# Patient Record
Sex: Female | Born: 1980 | Race: Black or African American | Hispanic: No | Marital: Single | Smoking: Current every day smoker
Health system: Southern US, Community
[De-identification: ages and names within clinical notes are randomized; demographics above are authoritative.]

## PROBLEM LIST (undated history)

## (undated) DIAGNOSIS — J45909 Unspecified asthma, uncomplicated: Secondary | ICD-10-CM

---
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
## 2002-08-27 ENCOUNTER — Emergency Department (HOSPITAL_COMMUNITY): Admission: EM | Admit: 2002-08-27 | Discharge: 2002-08-28 | Payer: Self-pay | Admitting: *Deleted

## 2004-02-25 ENCOUNTER — Emergency Department (HOSPITAL_COMMUNITY): Admission: EM | Admit: 2004-02-25 | Discharge: 2004-02-25 | Payer: Self-pay | Admitting: Emergency Medicine

## 2004-12-08 ENCOUNTER — Emergency Department (HOSPITAL_COMMUNITY): Admission: EM | Admit: 2004-12-08 | Discharge: 2004-12-08 | Payer: Self-pay | Admitting: Emergency Medicine

## 2005-07-22 ENCOUNTER — Emergency Department (HOSPITAL_COMMUNITY): Admission: EM | Admit: 2005-07-22 | Discharge: 2005-07-22 | Payer: Self-pay | Admitting: Family Medicine

## 2005-07-31 ENCOUNTER — Emergency Department (HOSPITAL_COMMUNITY): Admission: EM | Admit: 2005-07-31 | Discharge: 2005-07-31 | Payer: Self-pay | Admitting: Family Medicine

## 2006-05-26 ENCOUNTER — Emergency Department (HOSPITAL_COMMUNITY): Admission: EM | Admit: 2006-05-26 | Discharge: 2006-05-26 | Payer: Self-pay | Admitting: Emergency Medicine

## 2006-12-23 ENCOUNTER — Inpatient Hospital Stay (HOSPITAL_COMMUNITY): Admission: AD | Admit: 2006-12-23 | Discharge: 2006-12-23 | Payer: Self-pay | Admitting: Obstetrics and Gynecology

## 2006-12-25 ENCOUNTER — Emergency Department (HOSPITAL_COMMUNITY): Admission: EM | Admit: 2006-12-25 | Discharge: 2006-12-25 | Payer: Self-pay | Admitting: Emergency Medicine

## 2006-12-31 ENCOUNTER — Emergency Department (HOSPITAL_COMMUNITY): Admission: EM | Admit: 2006-12-31 | Discharge: 2006-12-31 | Payer: Self-pay | Admitting: Family Medicine

## 2007-03-13 ENCOUNTER — Emergency Department (HOSPITAL_COMMUNITY): Admission: EM | Admit: 2007-03-13 | Discharge: 2007-03-14 | Payer: Self-pay | Admitting: Emergency Medicine

## 2007-08-03 ENCOUNTER — Emergency Department (HOSPITAL_COMMUNITY): Admission: EM | Admit: 2007-08-03 | Discharge: 2007-08-03 | Payer: Self-pay | Admitting: Family Medicine

## 2007-10-25 ENCOUNTER — Emergency Department (HOSPITAL_COMMUNITY): Admission: EM | Admit: 2007-10-25 | Discharge: 2007-10-25 | Payer: Self-pay | Admitting: Emergency Medicine

## 2007-12-03 ENCOUNTER — Emergency Department (HOSPITAL_COMMUNITY): Admission: EM | Admit: 2007-12-03 | Discharge: 2007-12-03 | Payer: Self-pay | Admitting: Emergency Medicine

## 2008-03-24 ENCOUNTER — Emergency Department (HOSPITAL_COMMUNITY): Admission: EM | Admit: 2008-03-24 | Discharge: 2008-03-24 | Payer: Self-pay | Admitting: Emergency Medicine

## 2008-10-07 ENCOUNTER — Emergency Department (HOSPITAL_COMMUNITY): Admission: EM | Admit: 2008-10-07 | Discharge: 2008-10-07 | Payer: Self-pay | Admitting: Emergency Medicine

## 2009-10-08 ENCOUNTER — Emergency Department (HOSPITAL_COMMUNITY): Admission: EM | Admit: 2009-10-08 | Discharge: 2009-10-08 | Payer: Self-pay | Admitting: Emergency Medicine

## 2009-12-08 ENCOUNTER — Emergency Department (HOSPITAL_COMMUNITY): Admission: EM | Admit: 2009-12-08 | Discharge: 2009-12-08 | Payer: Self-pay | Admitting: Emergency Medicine

## 2009-12-09 ENCOUNTER — Emergency Department (HOSPITAL_COMMUNITY)
Admission: EM | Admit: 2009-12-09 | Discharge: 2009-12-09 | Payer: Self-pay | Source: Home / Self Care | Admitting: Emergency Medicine

## 2010-03-25 ENCOUNTER — Inpatient Hospital Stay (INDEPENDENT_AMBULATORY_CARE_PROVIDER_SITE_OTHER)
Admission: RE | Admit: 2010-03-25 | Discharge: 2010-03-25 | Disposition: A | Discharge status: Home / Self Care | Payer: Self-pay | Source: Ambulatory Visit | Attending: Emergency Medicine | Admitting: Emergency Medicine

## 2010-03-25 DIAGNOSIS — K047 Periapical abscess without sinus: Secondary | ICD-10-CM

## 2010-03-27 LAB — POCT PREGNANCY, URINE: Preg Test, Ur: NEGATIVE

## 2010-04-24 LAB — URINALYSIS, ROUTINE W REFLEX MICROSCOPIC
Bilirubin Urine: NEGATIVE
Nitrite: NEGATIVE
Specific Gravity, Urine: 1.005 (ref 1.005–1.030)
Urobilinogen, UA: 0.2 mg/dL (ref 0.0–1.0)

## 2010-04-24 LAB — URINE MICROSCOPIC-ADD ON

## 2010-04-24 LAB — POCT I-STAT, CHEM 8
Calcium, Ion: 1.03 mmol/L — ABNORMAL LOW (ref 1.12–1.32)
Chloride: 103 mEq/L (ref 96–112)
Glucose, Bld: 81 mg/dL (ref 70–99)
HCT: 35 % — ABNORMAL LOW (ref 36.0–46.0)

## 2010-06-21 ENCOUNTER — Emergency Department (HOSPITAL_COMMUNITY): Payer: Self-pay

## 2010-06-21 ENCOUNTER — Emergency Department (HOSPITAL_COMMUNITY)
Admission: EM | Admit: 2010-06-21 | Discharge: 2010-06-21 | Disposition: A | Discharge status: Home / Self Care | Payer: Self-pay | Attending: Emergency Medicine | Admitting: Emergency Medicine

## 2010-06-21 DIAGNOSIS — J45909 Unspecified asthma, uncomplicated: Secondary | ICD-10-CM | POA: Insufficient documentation

## 2010-06-21 DIAGNOSIS — R1013 Epigastric pain: Secondary | ICD-10-CM | POA: Insufficient documentation

## 2010-06-21 DIAGNOSIS — R112 Nausea with vomiting, unspecified: Secondary | ICD-10-CM | POA: Insufficient documentation

## 2010-06-21 DIAGNOSIS — R10816 Epigastric abdominal tenderness: Secondary | ICD-10-CM | POA: Insufficient documentation

## 2010-06-21 LAB — URINALYSIS, ROUTINE W REFLEX MICROSCOPIC
Bilirubin Urine: NEGATIVE
Hgb urine dipstick: NEGATIVE
Ketones, ur: NEGATIVE mg/dL
Protein, ur: NEGATIVE mg/dL
Urobilinogen, UA: 1 mg/dL (ref 0.0–1.0)

## 2010-06-21 LAB — COMPREHENSIVE METABOLIC PANEL
BUN: 12 mg/dL (ref 6–23)
CO2: 28 mEq/L (ref 19–32)
Calcium: 8.9 mg/dL (ref 8.4–10.5)
GFR calc Af Amer: >60 mL/min (ref >60)
GFR calc non Af Amer: >60 mL/min (ref >60)
Glucose, Bld: 78 mg/dL (ref 70–99)
Total Protein: 7.3 g/dL (ref 6.0–8.3)

## 2010-06-21 LAB — DIFFERENTIAL
Basophils Absolute: 0 10*3/uL (ref 0.0–0.1)
Eosinophils Relative: 4 % (ref 0–5)
Lymphocytes Relative: 49 % — ABNORMAL HIGH (ref 12–46)
Lymphs Abs: 2.8 10*3/uL (ref 0.7–4.0)
Monocytes Absolute: 0.5 10*3/uL (ref 0.1–1.0)
Monocytes Relative: 9 % (ref 3–12)

## 2010-06-21 LAB — CBC
HCT: 38 % (ref 36.0–46.0)
MCH: 26.9 pg (ref 26.0–34.0)
MCHC: 33.2 g/dL (ref 30.0–36.0)
MCV: 81 fL (ref 78.0–100.0)
RDW: 14.3 % (ref 11.5–15.5)

## 2010-06-21 LAB — LIPASE, BLOOD: Lipase: 16 U/L (ref 11–59)

## 2010-10-06 LAB — CBC
HCT: 39.3
Platelets: 287
RDW: 14.9
WBC: 6.6

## 2010-10-06 LAB — I-STAT 8, (EC8 V) (CONVERTED LAB)
Acid-Base Excess: 3 — ABNORMAL HIGH
BUN: 10
Bicarbonate: 24.5 — ABNORMAL HIGH
Chloride: 103
Glucose, Bld: 123 — ABNORMAL HIGH
HCT: 43
Hemoglobin: 14.6
Operator id: 277751
Potassium: 3.7
Sodium: 134 — ABNORMAL LOW
TCO2: 25
pCO2, Ven: 28.5 — ABNORMAL LOW
pH, Ven: 7.542 — ABNORMAL HIGH

## 2010-10-06 LAB — URINE MICROSCOPIC-ADD ON

## 2010-10-06 LAB — DIFFERENTIAL
Basophils Absolute: 0
Eosinophils Relative: 2
Lymphocytes Relative: 7 — ABNORMAL LOW
Lymphs Abs: 0.5 — ABNORMAL LOW
Neutro Abs: 5.7
Neutrophils Relative %: 87 — ABNORMAL HIGH

## 2010-10-06 LAB — URINALYSIS, ROUTINE W REFLEX MICROSCOPIC
Bilirubin Urine: NEGATIVE
Ketones, ur: NEGATIVE
Nitrite: NEGATIVE
pH: 8.5 — ABNORMAL HIGH

## 2010-10-06 LAB — POCT PREGNANCY, URINE
Operator id: 277751
Preg Test, Ur: NEGATIVE

## 2010-10-06 LAB — POCT I-STAT CREATININE
Creatinine, Ser: 1.1
Operator id: 277751

## 2010-10-06 LAB — INFLUENZA A+B VIRUS AG-DIRECT(RAPID)
Inflenza A Ag: NEGATIVE
Influenza B Ag: NEGATIVE

## 2010-10-14 LAB — COMPREHENSIVE METABOLIC PANEL
Albumin: 3.8
BUN: 7
Chloride: 106
Creatinine, Ser: 0.8
Total Bilirubin: 0.6
Total Protein: 7

## 2010-10-14 LAB — CBC
HCT: 34.3 — ABNORMAL LOW
MCV: 80.1
Platelets: 50 — ABNORMAL LOW
RDW: 14.2
WBC: 6.3

## 2010-10-14 LAB — DIFFERENTIAL
Blasts: 0
Eosinophils Absolute: 0.3
Eosinophils Relative: 4
Myelocytes: 0
Neutro Abs: 2.7
Neutrophils Relative %: 44
WBC Morphology: INCREASED
nRBC: 0

## 2010-10-14 LAB — URINALYSIS, ROUTINE W REFLEX MICROSCOPIC
Glucose, UA: NEGATIVE
Ketones, ur: NEGATIVE
Nitrite: NEGATIVE
Red Sub, UA: NEGATIVE
pH: 6

## 2010-10-14 LAB — ACETAMINOPHEN LEVEL: Acetaminophen (Tylenol), Serum: <10 — ABNORMAL LOW

## 2010-10-14 LAB — RAPID URINE DRUG SCREEN, HOSP PERFORMED
Cocaine: NOT DETECTED
Tetrahydrocannabinol: NOT DETECTED

## 2010-10-17 LAB — WET PREP, GENITAL
Clue Cells Wet Prep HPF POC: NONE SEEN
Trich, Wet Prep: NONE SEEN
Yeast Wet Prep HPF POC: NONE SEEN

## 2010-10-17 LAB — POCT URINALYSIS DIP (DEVICE)
Bilirubin Urine: NEGATIVE
Glucose, UA: NEGATIVE
Ketones, ur: NEGATIVE
Operator id: 247071
Protein, ur: NEGATIVE
Specific Gravity, Urine: 1.01

## 2010-10-17 LAB — GC/CHLAMYDIA PROBE AMP, GENITAL: GC Probe Amp, Genital: NEGATIVE

## 2010-10-20 LAB — CBC
MCHC: 33.8
Platelets: 314
RBC: 4.71
WBC: 5

## 2010-10-20 LAB — GC/CHLAMYDIA PROBE AMP, GENITAL
Chlamydia, DNA Probe: POSITIVE — AB
GC Probe Amp, Genital: NEGATIVE

## 2010-10-20 LAB — HCG, SERUM, QUALITATIVE: Preg, Serum: NEGATIVE

## 2010-10-20 LAB — URINALYSIS, ROUTINE W REFLEX MICROSCOPIC
Glucose, UA: NEGATIVE
Ketones, ur: 15 — AB
Protein, ur: NEGATIVE
pH: 6

## 2010-10-20 LAB — DIFFERENTIAL
Basophils Relative: 1
Eosinophils Absolute: 0.1 — ABNORMAL LOW
Eosinophils Relative: 2
Lymphocytes Relative: 47 — ABNORMAL HIGH
Monocytes Absolute: 0.4
Neutrophils Relative %: 42 — ABNORMAL LOW

## 2010-10-20 LAB — URINE MICROSCOPIC-ADD ON

## 2010-10-20 LAB — WET PREP, GENITAL

## 2010-10-20 LAB — POCT PREGNANCY, URINE: Preg Test, Ur: NEGATIVE

## 2011-07-12 ENCOUNTER — Encounter (HOSPITAL_COMMUNITY): Payer: Self-pay | Admitting: *Deleted

## 2011-07-12 ENCOUNTER — Emergency Department (HOSPITAL_COMMUNITY)
Admission: EM | Admit: 2011-07-12 | Discharge: 2011-07-12 | Disposition: A | Discharge status: Home / Self Care | Payer: Self-pay | Attending: Emergency Medicine | Admitting: Emergency Medicine

## 2011-07-12 DIAGNOSIS — L923 Foreign body granuloma of the skin and subcutaneous tissue: Secondary | ICD-10-CM | POA: Insufficient documentation

## 2011-07-12 DIAGNOSIS — L299 Pruritus, unspecified: Secondary | ICD-10-CM | POA: Insufficient documentation

## 2011-07-12 MED ORDER — SULFAMETHOXAZOLE-TRIMETHOPRIM 800-160 MG PO TABS: 1.0000 | ORAL_TABLET | Freq: Two times a day (BID) | ORAL | Status: AC
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
## 2011-10-17 ENCOUNTER — Emergency Department (HOSPITAL_COMMUNITY): Payer: No Typology Code available for payment source

## 2011-10-17 ENCOUNTER — Encounter (HOSPITAL_COMMUNITY): Payer: Self-pay | Admitting: *Deleted

## 2011-10-17 ENCOUNTER — Emergency Department (HOSPITAL_COMMUNITY)
Admission: EM | Admit: 2011-10-17 | Discharge: 2011-10-17 | Disposition: A | Discharge status: Home / Self Care | Payer: No Typology Code available for payment source | Attending: Emergency Medicine | Admitting: Emergency Medicine

## 2011-10-17 DIAGNOSIS — R079 Chest pain, unspecified: Secondary | ICD-10-CM | POA: Insufficient documentation

## 2011-10-17 DIAGNOSIS — T1490XA Injury, unspecified, initial encounter: Secondary | ICD-10-CM | POA: Insufficient documentation

## 2011-10-17 DIAGNOSIS — F10929 Alcohol use, unspecified with intoxication, unspecified: Secondary | ICD-10-CM

## 2011-10-17 DIAGNOSIS — F1010 Alcohol abuse, uncomplicated: Secondary | ICD-10-CM | POA: Insufficient documentation

## 2011-10-17 DIAGNOSIS — M549 Dorsalgia, unspecified: Secondary | ICD-10-CM | POA: Insufficient documentation

## 2011-10-17 DIAGNOSIS — R52 Pain, unspecified: Secondary | ICD-10-CM | POA: Insufficient documentation

## 2011-10-17 DIAGNOSIS — R5381 Other malaise: Secondary | ICD-10-CM | POA: Insufficient documentation

## 2011-10-17 DIAGNOSIS — R109 Unspecified abdominal pain: Secondary | ICD-10-CM | POA: Insufficient documentation

## 2011-10-17 LAB — CBC WITH DIFFERENTIAL/PLATELET
Eosinophils Absolute: 0.2 10*3/uL (ref 0.0–0.7)
Eosinophils Relative: 4 % (ref 0–5)
HCT: 36.9 % (ref 36.0–46.0)
Lymphocytes Relative: 45 % (ref 12–46)
Lymphs Abs: 2.7 10*3/uL (ref 0.7–4.0)
MCH: 25.4 pg — ABNORMAL LOW (ref 26.0–34.0)
MCV: 79.4 fL (ref 78.0–100.0)
Monocytes Absolute: 0.3 10*3/uL (ref 0.1–1.0)
Monocytes Relative: 5 % (ref 3–12)
Platelets: 290 10*3/uL (ref 150–400)
RBC: 4.65 MIL/uL (ref 3.87–5.11)
WBC: 5.9 10*3/uL (ref 4.0–10.5)

## 2011-10-17 LAB — BASIC METABOLIC PANEL
BUN: 10 mg/dL (ref 6–23)
CO2: 27 mEq/L (ref 19–32)
Calcium: 8.8 mg/dL (ref 8.4–10.5)
Creatinine, Ser: 0.84 mg/dL (ref 0.50–1.10)
GFR calc non Af Amer: >90 mL/min (ref >90)
Glucose, Bld: 130 mg/dL — ABNORMAL HIGH (ref 70–99)

## 2011-10-17 LAB — RAPID URINE DRUG SCREEN, HOSP PERFORMED
Amphetamines: NOT DETECTED
Benzodiazepines: NOT DETECTED
Cocaine: NOT DETECTED
Opiates: NOT DETECTED
Tetrahydrocannabinol: NOT DETECTED

## 2011-10-17 MED ORDER — HYDROCODONE-ACETAMINOPHEN 5-325 MG PO TABS: 1.0000 | ORAL_TABLET | Freq: Four times a day (QID) | ORAL | Status: DC | PRN

## 2011-10-17 MED ORDER — IOHEXOL 300 MG/ML  SOLN
100.0000 mL | Freq: Once | INTRAMUSCULAR | Status: AC | PRN
  Administered 2011-10-17: 100 mL via INTRAVENOUS
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
## 2011-10-17 NOTE — ED Notes (Signed)
Patient transported to CT 

## 2013-04-19 ENCOUNTER — Emergency Department (HOSPITAL_COMMUNITY)
Admission: EM | Admit: 2013-04-19 | Discharge: 2013-04-20 | Disposition: A | Discharge status: Home / Self Care | Payer: No Typology Code available for payment source | Attending: Emergency Medicine | Admitting: Emergency Medicine

## 2013-04-19 DIAGNOSIS — IMO0002 Reserved for concepts with insufficient information to code with codable children: Secondary | ICD-10-CM

## 2013-04-19 DIAGNOSIS — S0180XA Unspecified open wound of other part of head, initial encounter: Secondary | ICD-10-CM | POA: Insufficient documentation

## 2013-04-19 DIAGNOSIS — S0100XA Unspecified open wound of scalp, initial encounter: Secondary | ICD-10-CM | POA: Insufficient documentation

## 2013-04-19 DIAGNOSIS — F17200 Nicotine dependence, unspecified, uncomplicated: Secondary | ICD-10-CM | POA: Insufficient documentation
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
## 2013-04-20 ENCOUNTER — Encounter (HOSPITAL_COMMUNITY): Payer: Self-pay | Admitting: Emergency Medicine
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
## 2013-04-20 NOTE — ED Notes (Signed)
Called without response 

## 2013-04-27 ENCOUNTER — Emergency Department (HOSPITAL_COMMUNITY)
Admission: EM | Admit: 2013-04-27 | Discharge: 2013-04-27 | Disposition: A | Discharge status: Home / Self Care | Payer: No Typology Code available for payment source | Attending: Emergency Medicine | Admitting: Emergency Medicine

## 2013-04-27 ENCOUNTER — Encounter (HOSPITAL_COMMUNITY): Payer: Self-pay | Admitting: Emergency Medicine

## 2013-04-27 DIAGNOSIS — F17200 Nicotine dependence, unspecified, uncomplicated: Secondary | ICD-10-CM | POA: Insufficient documentation

## 2013-04-27 DIAGNOSIS — Z4802 Encounter for removal of sutures: Secondary | ICD-10-CM | POA: Insufficient documentation

## 2013-04-27 NOTE — Discharge Instructions (Signed)
Bacitracin topically to the wound. Do not pick at the scab. Follow up as needed.    Suture Removal, Care After Refer to this sheet in the next few weeks. These instructions provide you with information on caring for yourself after your procedure. Your health care provider may also give you more specific instructions. Your treatment has been planned according to current medical practices, but problems sometimes occur. Call your health care provider if you have any problems or questions after your procedure. WHAT TO EXPECT AFTER THE PROCEDURE After your stitches (sutures) are removed, it is typical to have the following:  Some discomfort and swelling in the wound area.  Slight redness in the area. HOME CARE INSTRUCTIONS   If you have skin adhesive strips over the wound area, do not take the strips off. They will fall off on their own in a few days. If the strips remain in place after 14 days, you may remove them.  Change any bandages (dressings) at least once a day or as directed by your health care provider. If the bandage sticks, soak it off with warm, soapy water.  Apply cream or ointment only as directed by your health care provider. If using cream or ointment, wash the area with soap and water 2 times a day to remove all the cream or ointment. Rinse off the soap and pat the area dry with a clean towel.  Keep the wound area dry and clean. If the bandage becomes wet or dirty, or if it develops a bad smell, change it as soon as possible.  Continue to protect the wound from injury.  Use sunscreen when out in the sun. New scars become sunburned easily. SEEK MEDICAL CARE IF:  You have increasing redness, swelling, or pain in the wound.  You see pus coming from the wound.  You have a fever.  You notice a bad smell coming from the wound or dressing.  Your wound breaks open (edges not staying together). Document Released: 09/23/2000 Document Revised: 10/19/2012 Document Reviewed:  08/10/2012 [facility] Patient Information 2014 [place]ExitCare, [place].

## 2013-04-27 NOTE — ED Provider Notes (Signed)
Medical screening examination/treatment/procedure(s) were performed by non-physician practitioner and as supervising physician I was immediately available for consultation/collaboration.   EKG Interpretation None       Sunnie NielsenBrian Mariette Cowley, MD 04/27/13 2300
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
## 2013-04-27 NOTE — ED Notes (Signed)
Pt in requesting suture removal from right forehead area, placed on 4/8

## 2013-05-01 NOTE — ED Provider Notes (Signed)
Medical screening examination/treatment/procedure(s) were performed by non-physician practitioner and as supervising physician I was immediately available for consultation/collaboration.   EKG Interpretation None       Raeford RazorStephen Hazen Brumett, MD 05/01/13 [phone]

## 2013-06-01 ENCOUNTER — Encounter (HOSPITAL_COMMUNITY): Payer: Self-pay | Admitting: Emergency Medicine

## 2013-06-01 ENCOUNTER — Emergency Department (HOSPITAL_COMMUNITY)
Admission: EM | Admit: 2013-06-01 | Discharge: 2013-06-01 | Disposition: A | Discharge status: Home / Self Care | Payer: No Typology Code available for payment source | Attending: Emergency Medicine | Admitting: Emergency Medicine

## 2013-06-01 DIAGNOSIS — Z23 Encounter for immunization: Secondary | ICD-10-CM | POA: Insufficient documentation

## 2013-06-01 DIAGNOSIS — F17200 Nicotine dependence, unspecified, uncomplicated: Secondary | ICD-10-CM | POA: Insufficient documentation

## 2013-06-01 DIAGNOSIS — S5010XA Contusion of unspecified forearm, initial encounter: Secondary | ICD-10-CM | POA: Insufficient documentation

## 2013-06-01 DIAGNOSIS — S51809A Unspecified open wound of unspecified forearm, initial encounter: Secondary | ICD-10-CM | POA: Insufficient documentation

## 2013-06-01 DIAGNOSIS — J45909 Unspecified asthma, uncomplicated: Secondary | ICD-10-CM | POA: Insufficient documentation

## 2013-06-01 DIAGNOSIS — Y939 Activity, unspecified: Secondary | ICD-10-CM | POA: Insufficient documentation

## 2013-06-01 DIAGNOSIS — W503XXA Accidental bite by another person, initial encounter: Secondary | ICD-10-CM | POA: Insufficient documentation

## 2013-06-01 DIAGNOSIS — Y929 Unspecified place or not applicable: Secondary | ICD-10-CM | POA: Insufficient documentation

## 2013-06-01 HISTORY — DX: Unspecified asthma, uncomplicated: J45.909

## 2013-06-01 MED ORDER — PENICILLIN V POTASSIUM 500 MG PO TABS: 500.0000 mg | ORAL_TABLET | Freq: Three times a day (TID) | ORAL | Status: DC

## 2013-06-01 MED ORDER — TETANUS-DIPHTH-ACELL PERTUSSIS 5-2.5-18.5 LF-MCG/0.5 IM SUSP
0.5000 mL | Freq: Once | INTRAMUSCULAR | Status: AC
  Administered 2013-06-01: 0.5 mL via INTRAMUSCULAR
  Filled 2013-06-01: qty 0.5

## 2013-06-01 MED ORDER — SULFAMETHOXAZOLE-TRIMETHOPRIM 800-160 MG PO TABS: 1.0000 | ORAL_TABLET | Freq: Two times a day (BID) | ORAL | Status: DC
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
## 2013-06-02 NOTE — ED Provider Notes (Signed)
Medical screening examination/treatment/procedure(s) were performed by non-physician practitioner and as supervising physician I was immediately available for consultation/collaboration.   EKG Interpretation None        Galit Urich J. Jakevion Arney, MD 06/02/13 0832 

## 2013-11-13 ENCOUNTER — Encounter (HOSPITAL_COMMUNITY): Payer: Self-pay | Admitting: Emergency Medicine

## 2014-12-16 ENCOUNTER — Emergency Department (HOSPITAL_COMMUNITY): Payer: Self-pay

## 2014-12-16 ENCOUNTER — Encounter (HOSPITAL_COMMUNITY): Payer: Self-pay | Admitting: *Deleted

## 2014-12-16 ENCOUNTER — Emergency Department (HOSPITAL_COMMUNITY)
Admission: EM | Admit: 2014-12-16 | Discharge: 2014-12-16 | Disposition: A | Discharge status: Home / Self Care | Payer: Self-pay | Attending: Emergency Medicine | Admitting: Emergency Medicine

## 2014-12-16 DIAGNOSIS — S0003XA Contusion of scalp, initial encounter: Secondary | ICD-10-CM | POA: Insufficient documentation

## 2014-12-16 DIAGNOSIS — J45909 Unspecified asthma, uncomplicated: Secondary | ICD-10-CM | POA: Insufficient documentation

## 2014-12-16 DIAGNOSIS — S29002A Unspecified injury of muscle and tendon of back wall of thorax, initial encounter: Secondary | ICD-10-CM | POA: Insufficient documentation

## 2014-12-16 DIAGNOSIS — Y9389 Activity, other specified: Secondary | ICD-10-CM | POA: Insufficient documentation

## 2014-12-16 DIAGNOSIS — S3992XA Unspecified injury of lower back, initial encounter: Secondary | ICD-10-CM | POA: Insufficient documentation

## 2014-12-16 DIAGNOSIS — S0990XA Unspecified injury of head, initial encounter: Secondary | ICD-10-CM

## 2014-12-16 DIAGNOSIS — S199XXA Unspecified injury of neck, initial encounter: Secondary | ICD-10-CM | POA: Insufficient documentation

## 2014-12-16 DIAGNOSIS — F17200 Nicotine dependence, unspecified, uncomplicated: Secondary | ICD-10-CM | POA: Insufficient documentation

## 2014-12-16 DIAGNOSIS — Y92410 Unspecified street and highway as the place of occurrence of the external cause: Secondary | ICD-10-CM | POA: Insufficient documentation

## 2014-12-16 DIAGNOSIS — Y998 Other external cause status: Secondary | ICD-10-CM | POA: Insufficient documentation

## 2014-12-16 MED ORDER — IBUPROFEN 800 MG PO TABS: 800.0000 mg | ORAL_TABLET | Freq: Three times a day (TID) | ORAL | Status: DC

## 2014-12-16 MED ORDER — IBUPROFEN 800 MG PO TABS
800.0000 mg | ORAL_TABLET | Freq: Once | ORAL | Status: AC
  Administered 2014-12-16: 800 mg via ORAL
  Filled 2014-12-16: qty 1
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
## 2015-01-24 ENCOUNTER — Emergency Department (HOSPITAL_COMMUNITY): Payer: No Typology Code available for payment source

## 2015-01-24 ENCOUNTER — Emergency Department (HOSPITAL_COMMUNITY)
Admission: EM | Admit: 2015-01-24 | Discharge: 2015-01-24 | Disposition: A | Discharge status: Home / Self Care | Payer: No Typology Code available for payment source | Attending: Physician Assistant | Admitting: Physician Assistant

## 2015-01-24 ENCOUNTER — Encounter (HOSPITAL_COMMUNITY): Payer: Self-pay | Admitting: Emergency Medicine

## 2015-01-24 DIAGNOSIS — S3992XA Unspecified injury of lower back, initial encounter: Secondary | ICD-10-CM | POA: Diagnosis not present

## 2015-01-24 DIAGNOSIS — J45909 Unspecified asthma, uncomplicated: Secondary | ICD-10-CM | POA: Insufficient documentation

## 2015-01-24 DIAGNOSIS — F17200 Nicotine dependence, unspecified, uncomplicated: Secondary | ICD-10-CM | POA: Insufficient documentation

## 2015-01-24 DIAGNOSIS — Y92410 Unspecified street and highway as the place of occurrence of the external cause: Secondary | ICD-10-CM | POA: Insufficient documentation

## 2015-01-24 DIAGNOSIS — Y9389 Activity, other specified: Secondary | ICD-10-CM | POA: Insufficient documentation

## 2015-01-24 DIAGNOSIS — Y998 Other external cause status: Secondary | ICD-10-CM | POA: Diagnosis not present

## 2015-01-24 DIAGNOSIS — S199XXA Unspecified injury of neck, initial encounter: Secondary | ICD-10-CM | POA: Diagnosis not present

## 2015-01-24 MED ORDER — CYCLOBENZAPRINE HCL 10 MG PO TABS: 10.0000 mg | ORAL_TABLET | Freq: Two times a day (BID) | ORAL | Status: DC | PRN

## 2015-01-24 MED ORDER — HYDROCODONE-ACETAMINOPHEN 5-325 MG PO TABS
1.0000 | ORAL_TABLET | Freq: Once | ORAL | Status: AC
Start: 2015-01-24 — End: 2015-01-24
  Administered 2015-01-24: 1 via ORAL
  Filled 2015-01-24: qty 1

## 2015-01-24 MED ORDER — IBUPROFEN 800 MG PO TABS: 800.0000 mg | ORAL_TABLET | Freq: Three times a day (TID) | ORAL | Status: DC
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
## 2015-01-24 NOTE — ED Notes (Signed)
Patient transported to CT 
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
## 2015-01-24 NOTE — Discharge Instructions (Signed)
Take your medications as prescribed. I also recommend applying ice to affected regions for 15-20 minutes 3-4 times daily. Please follow up with a primary care provider from the Resource Guide provided below in 3-4 days. Please return to the Emergency Department if symptoms worsen or new onset of fever, headache, visual changes, lightheadedness, dizziness, numbness, tingling, weakness, groin anesthesia, loss of bowel or bladder.    Emergency Department Resource Guide 1) Find a Doctor and Pay Out of Pocket Although you won't have to find out who is covered by your insurance plan, it is a good idea to ask around and get recommendations. You will then need to call the office and see if the doctor you have chosen will accept you as a new patient and what types of options they offer for patients who are self-pay. Some doctors offer discounts or will set up payment plans for their patients who do not have insurance, but you will need to ask so you aren't surprised when you get to your appointment.  2) Contact Your Local Health Department Not all health departments have doctors that can see patients for sick visits, but many do, so it is worth a call to see if yours does. If you don't know where your local health department is, you can check in your phone book. The CDC also has a tool to help you locate your state's health department, and many state websites also have listings of all of their local health departments.  3) Find a Walk-in Clinic If your illness is not likely to be very severe or complicated, you may want to try a walk in clinic. These are popping up all over the country in pharmacies, drugstores, and shopping centers. They're usually staffed by nurse practitioners or physician assistants that have been trained to treat common illnesses and complaints. They're usually fairly quick and inexpensive. However, if you have serious medical issues or chronic medical problems, these are probably not your  best option.  No Primary Care Doctor: - Call Health Connect at  [phone] - they can help you locate a primary care doctor that  accepts your insurance, provides certain services, etc. - Physician Referral Service- [phone]  Chronic Pain Problems: Organization         Address  Phone   Notes  [facility]  [phone] Patients need to be referred by their primary care doctor.   Medication Assistance: Organization         Address  Phone   Notes  [facility] [address] [place], [place] [address] [phone]) [phone] --Must be a resident of [place] [facility] [place] -- Must have NO insurance coverage whatsoever (no Medicaid/ Medicare, etc.) -- The pt. MUST have a primary care doctor that directs their care regularly and follows them in the community   MedAssist  [phone]   [employer]  [phone]) [phone]    Agencies that provide inexpensive medical care: Organization         Address  Phone   Notes  [facility]  [phone]) [phone]   [facility]    [phone]   [facility] [address] [place], [place] [address] [phone]) [phone]   [facility] [address], [place] [phone]   [facility]anned [facility]    [phone]   [facility]    [phone]   [facility]  [address], [place] Phone:  [phone], Fax:  [phone] Hours of Operation:  9 am - 6 pm, M-F.  Also accepts Medicaid/Medicare and self-pay.  [facility] [place], [address], [place] Phone: [phone], Fax: [phone]. Hours of Operation:  8:30 am - 5:30 pm, M-F.  Also accepts Medicaid and self-pay.  [facility] [place] [address] [place] Phone: [phone]   [place], [place], [place] [phone], Ext. [phone] Mondays & Thursdays: 7-9 AM.  First 15  patients are seen on a first come, first serve basis.    [place] Providers:  Organization         Address  Phone   Notes  [facility] [address], [place] [phone] Also accepts self-pay patients.  [facility] [address], [place]  [phone]   [place], [address], [place] [phone]   [facility] [address], [place] [phone]   Lucianne Lei [address], [place]   [phone] Only accepts [place] Access [place] patients after they have their name applied to their card.   Self-Pay (no insurance) in [facility]:  Organization         Address  Phone   Notes  Sickle Cell Patients, [facility] [place] [phone]   [facility] [place] [phone]   [facility]  [place], [place], [place] [phone]   [facility]/Dr. Osei-Bonsu  [address], [place] or [address], [place] [phone] Phone number for both [place] and [place] locations is the same.  [facility] [address] [place] [phone]   [facility] [address], [place] or [address] [phone]   [facility] [address] [place] [phone], phone; [phone], fax Sees patients 1st and 3rd Saturday of every month.  Must not qualify for public or private insurance (i.e. Medicaid, Medicare, [place] Health Choice, Veterans' Benefits)  Household income should be no more than 200% of the poverty level The clinic cannot treat you if you are pregnant or think you are pregnant  Sexually transmitted diseases are not treated at the clinic.    Dental  Care: Organization         Address  Phone  Notes  [facility] [place] [phone] Accepts children up to age 58 who are enrolled in [place] or [place]; pregnant women with a Medicaid card; and children who have applied for Medicaid or [place] Health Choice, but were declined, whose parents can pay a reduced fee at time of service.  [facility]  [address], [place] [phone] Accepts children up to age 82 who are enrolled in [place] or [place]; pregnant women with a Medicaid card; and children who have applied for Medicaid or  Health Choice, but were declined, whose parents can pay a reduced fee at time of service.  [facility] Adult Dental Access PROGRAM  [place] [phone] Patients are  seen by appointment only. Walk-ins are not accepted. [place] will see patients 55 years of age and older. Monday - Tuesday (8am-5pm) Most Wednesdays (8:30-5pm) $30 per visit, cash only  [facility] Adult Dental Access PROGRAM  [address], [facility] [phone] Patients are seen by appointment only. Walk-ins are not accepted. [place] will see patients 42 years of age and older. One Wednesday Evening (Monthly: Volunteer Based).  $30 per visit, cash only  [place]  [phone] for adults; Children under age 42, call Graduate Pediatric Dentistry at [phone]. Children aged 37-14, please call [phone] to request a pediatric application.  Dental services are provided in all areas of dental care including fillings, crowns and bridges, complete and partial dentures, implants, gum treatment, root canals, and extractions. Preventive care is also provided. Treatment is provided to both adults and children. Patients are selected via a lottery and there is often a waiting list.   [facility] [address] [place]  [phone] [email or web address]   [facility] [address] [place], [place] [phone], Ext. [phone] Second and Fourth Thursday of each month, opens at 6:30 AM; Clinic ends at 9 AM.  Patients are seen on a first-come first-served basis, and a limited number are seen during each clinic.   [facility]  [address] Hillard Danker [place], [place] [phone]   Eligibility Requirements You must have lived in [place], [place], or [place] counties for at least the last three months.   You cannot be eligible for state or federal sponsored [employer], including [employer], [place], or [employer].   You generally cannot be eligible for healthcare insurance through your employer.    How to apply: Eligibility screenings are held every Tuesday and Wednesday afternoon from 1:00 pm until 4:00 pm. You do not need an appointment for the interview!  [facility] [address], [place], [place]   [place]  [place] Department  [place]  [phone]    Behavioral Health Resources in the Community: Intensive Outpatient Programs Organization         Address  Phone  Notes  [place]. [address], [place], [place] [phone]   [facility] [address] [place], [place]   ADS: [facility] Drug [facility] [address], [place], [place]   [place] [address]  [place], [place] or [phone]   Substance Abuse Resources Organization         Address  Phone  Notes  Al[facility] Drug Services  [phone]   [place]  [phone]   [facility] [place]   Chinita Pester  [phone]   Residential & Outpatient Substance Abuse Program  [phone]    Psychological Services Organization         Address  Phone  Notes  [facility] [place]  [place]  [phone]   [place] [address] [place] or [phone]    Mobile Crisis Teams Organization         Address  Phone  Notes  [facility], Mobile Crisis Care Unit  [phone]   [facility]ive Psychotherapeutic Services  [address] [place], [place]   [facility] [address] [place]  [place] [phone]    Self-Help/Support Groups Organization         Address  Phone             Notes  [facility] of [place] - variety of support groups  [phone]- I7437963 Call for more information  Narcotics Anonymous (NA), [facility] [address], [employer] [place]  2 meetings at this location   Statistician         Address  Phone  Notes  [facility] [address],    [place]  [phone]   [facility]  [address] [place] [address], [place], [place] [phone]   [facility] [address] [place], [place] [phone] Admissions: 8am-3pm M-F  [facility] [address],    [place], [place] [phone]   [facility] [address] [place], [place], [place] [phone]   [facility] [address],  [place], [place] [phone]   [facility] - Intensive Outpatient [address]ure[address], [place], [place] [phone]   [facility] ([facility] [address] [place].,  [place], [place] [phone] or [phone]   [facility] ([facility]) [address] [place], [place] [phone] Accepts Medicaid  [facility] [address],  [place] [phone] Substance Abuse/Addiction Treatment   [facility] Organization         Address  Phone  Notes  [facility]  [phone]   Angie Fava, PhD [address] Ervin Knack [place], [place]   [phone] or 7147826561   [facility] Behavioral   [address] [place], [place] [phone]   [facility] [address] [place], [place] [phone] Insurance/Medicaid/sponsorship through [facility] [address]                                    [place], [place] [phone] Therapy/tele-psych/case  [facility] [address], [place] [phone]    Dr. Lolly Mustache  [phone]   [facility] [place]  United Way [facility] 1) [address], [place] 2) [address] [place] 3)  [address], [place] [phone]  [phone]   [facility] Child Abuse Hotline 743-018-6863 or 785-174-0867 (After Hours)

## 2017-07-18 ENCOUNTER — Emergency Department (HOSPITAL_COMMUNITY): Payer: No Typology Code available for payment source

## 2017-07-18 ENCOUNTER — Emergency Department (HOSPITAL_COMMUNITY)
Admission: EM | Admit: 2017-07-18 | Discharge: 2017-07-18 | Disposition: A | Discharge status: Home / Self Care | Payer: No Typology Code available for payment source | Attending: Emergency Medicine | Admitting: Emergency Medicine

## 2017-07-18 ENCOUNTER — Other Ambulatory Visit: Payer: Self-pay

## 2017-07-18 ENCOUNTER — Encounter (HOSPITAL_COMMUNITY): Payer: Self-pay

## 2017-07-18 DIAGNOSIS — F17200 Nicotine dependence, unspecified, uncomplicated: Secondary | ICD-10-CM | POA: Insufficient documentation

## 2017-07-18 DIAGNOSIS — J45909 Unspecified asthma, uncomplicated: Secondary | ICD-10-CM | POA: Diagnosis not present

## 2017-07-18 DIAGNOSIS — M7918 Myalgia, other site: Secondary | ICD-10-CM | POA: Insufficient documentation

## 2017-07-18 LAB — POC URINE PREG, ED: Preg Test, Ur: NEGATIVE

## 2017-07-18 MED ORDER — IBUPROFEN 800 MG PO TABS
800.0000 mg | ORAL_TABLET | Freq: Once | ORAL | Status: AC
  Administered 2017-07-18: 800 mg via ORAL
  Filled 2017-07-18: qty 1

## 2017-07-18 MED ORDER — IBUPROFEN 800 MG PO TABS: 800.0000 mg | ORAL_TABLET | Freq: Three times a day (TID) | ORAL | 0 refills | Status: AC
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
## 2017-07-18 NOTE — ED Notes (Signed)
Patient transported to X-ray 

## 2017-07-18 NOTE — ED Notes (Signed)
Patient verbalizes understanding of discharge instructions. Opportunity for questioning and answers were provided. Armband removed by staff, pt discharged from ED.  

## 2017-07-18 NOTE — Discharge Instructions (Signed)
Do not be surprised if you are more sore in the morning. I have prescribed you Ibuprofen to assist with inflammation and pain. You may also use heat or cold compresses for additional relief.
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
## 2018-04-16 ENCOUNTER — Emergency Department (HOSPITAL_COMMUNITY): Payer: Self-pay

## 2018-04-16 ENCOUNTER — Encounter (HOSPITAL_COMMUNITY): Payer: Self-pay | Admitting: Emergency Medicine

## 2018-04-16 ENCOUNTER — Emergency Department (HOSPITAL_COMMUNITY)
Admission: EM | Admit: 2018-04-16 | Discharge: 2018-04-16 | Disposition: A | Discharge status: Home / Self Care | Payer: Self-pay | Attending: Emergency Medicine | Admitting: Emergency Medicine

## 2018-04-16 ENCOUNTER — Other Ambulatory Visit: Payer: Self-pay

## 2018-04-16 DIAGNOSIS — Y999 Unspecified external cause status: Secondary | ICD-10-CM | POA: Insufficient documentation

## 2018-04-16 DIAGNOSIS — J45909 Unspecified asthma, uncomplicated: Secondary | ICD-10-CM | POA: Insufficient documentation

## 2018-04-16 DIAGNOSIS — Y929 Unspecified place or not applicable: Secondary | ICD-10-CM | POA: Insufficient documentation

## 2018-04-16 DIAGNOSIS — Y939 Activity, unspecified: Secondary | ICD-10-CM | POA: Insufficient documentation

## 2018-04-16 DIAGNOSIS — S39012A Strain of muscle, fascia and tendon of lower back, initial encounter: Secondary | ICD-10-CM

## 2018-04-16 DIAGNOSIS — S161XXA Strain of muscle, fascia and tendon at neck level, initial encounter: Secondary | ICD-10-CM

## 2018-04-16 DIAGNOSIS — F17210 Nicotine dependence, cigarettes, uncomplicated: Secondary | ICD-10-CM | POA: Insufficient documentation

## 2018-04-16 LAB — CBC
HCT: 39.4 % (ref 36.0–46.0)
Hemoglobin: 11.6 g/dL — ABNORMAL LOW (ref 12.0–15.0)
MCH: 23.5 pg — ABNORMAL LOW (ref 26.0–34.0)
MCHC: 29.4 g/dL — ABNORMAL LOW (ref 30.0–36.0)
MCV: 79.8 fL — ABNORMAL LOW (ref 80.0–100.0)
Platelets: 337 10*3/uL (ref 150–400)
RBC: 4.94 MIL/uL (ref 3.87–5.11)
RDW: 16.3 % — ABNORMAL HIGH (ref 11.5–15.5)
WBC: 7 10*3/uL (ref 4.0–10.5)
nRBC: 0 % (ref 0.0–0.2)

## 2018-04-16 LAB — COMPREHENSIVE METABOLIC PANEL
ALT: 69 U/L — ABNORMAL HIGH (ref 0–44)
AST: 25 U/L (ref 15–41)
Albumin: 3.4 g/dL — ABNORMAL LOW (ref 3.5–5.0)
Alkaline Phosphatase: 66 U/L (ref 38–126)
Anion gap: 9 (ref 5–15)
BUN: 8 mg/dL (ref 6–20)
CO2: 25 mmol/L (ref 22–32)
Calcium: 9 mg/dL (ref 8.9–10.3)
Chloride: 104 mmol/L (ref 98–111)
Creatinine, Ser: 0.74 mg/dL (ref 0.44–1.00)
GFR calc Af Amer: >60 mL/min (ref >60)
GFR calc non Af Amer: >60 mL/min (ref >60)
Glucose, Bld: 125 mg/dL — ABNORMAL HIGH (ref 70–99)
Potassium: 3.2 mmol/L — ABNORMAL LOW (ref 3.5–5.1)
Sodium: 138 mmol/L (ref 135–145)
Total Bilirubin: 0.1 mg/dL — ABNORMAL LOW (ref 0.3–1.2)
Total Protein: 6.8 g/dL (ref 6.5–8.1)

## 2018-04-16 LAB — ETHANOL: Alcohol, Ethyl (B): 185 mg/dL — ABNORMAL HIGH (ref <10)

## 2018-04-16 LAB — I-STAT BETA HCG BLOOD, ED (MC, WL, AP ONLY): I-stat hCG, quantitative: <5 m[IU]/mL (ref <5)

## 2018-04-16 MED ORDER — IOHEXOL 300 MG/ML  SOLN
125.0000 mL | Freq: Once | INTRAMUSCULAR | Status: AC | PRN
  Administered 2018-04-16: 125 mL via INTRAVENOUS

## 2018-04-16 MED ORDER — METHOCARBAMOL 500 MG PO TABS: 500.0000 mg | ORAL_TABLET | Freq: Three times a day (TID) | ORAL | 0 refills | Status: DC | PRN

## 2018-04-16 MED ORDER — ONDANSETRON HCL 4 MG/2ML IJ SOLN
4.0000 mg | Freq: Once | INTRAMUSCULAR | Status: AC
  Administered 2018-04-16: 03:00:00 4 mg via INTRAVENOUS
  Filled 2018-04-16: qty 2

## 2018-04-16 MED ORDER — SODIUM CHLORIDE 0.9 % IV BOLUS
1000.0000 mL | Freq: Once | INTRAVENOUS | Status: AC
  Administered 2018-04-16: 03:00:00 1000 mL via INTRAVENOUS

## 2018-04-16 MED ORDER — MORPHINE SULFATE (PF) 4 MG/ML IV SOLN
4.0000 mg | Freq: Once | INTRAVENOUS | Status: AC
  Administered 2018-04-16: 4 mg via INTRAVENOUS
  Filled 2018-04-16: qty 1

## 2018-04-16 MED ORDER — IBUPROFEN 800 MG PO TABS: 800.0000 mg | ORAL_TABLET | Freq: Four times a day (QID) | ORAL | 0 refills | Status: DC | PRN
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
## 2018-04-16 NOTE — ED Notes (Signed)
Reviewed d/c instructions with pt, who verbalized understanding and had no outstanding questions. Pt departed in NAD, refused use of wheelchair.   

## 2018-04-16 NOTE — ED Notes (Signed)
Patient transported to X-ray 

## 2019-05-06 ENCOUNTER — Ambulatory Visit: Payer: Self-pay | Attending: Internal Medicine

## 2019-05-06 DIAGNOSIS — Z23 Encounter for immunization: Secondary | ICD-10-CM

## 2019-05-06 NOTE — Progress Notes (Signed)
   Covid-19 Vaccination Clinic  Name:  DESERA GRAFFEO    MRN: [id number] DOB: 13-Feb-1980  05/06/2019  Ms. Uecker was observed post Covid-19 immunization for 15 minutes without incident. She was provided with Vaccine Information Sheet and instruction to access the V-Safe system.   Ms. Stys was instructed to call 911 with any severe reactions post vaccine: Marland Kitchen Difficulty breathing  . Swelling of face and throat  . A fast heartbeat  . A bad rash all over body  . Dizziness and weakness   Immunizations Administered    Name Date Dose VIS Date Route   Pfizer COVID-19 Vaccine 05/06/2019  9:17 AM 0.3 mL 03/08/2018 Intramuscular   Manufacturer: [employer], [employer]   Lot: [id number]   NDC: [address]-4

## 2019-06-03 ENCOUNTER — Ambulatory Visit: Payer: Self-pay | Attending: Internal Medicine

## 2019-06-03 DIAGNOSIS — Z23 Encounter for immunization: Secondary | ICD-10-CM

## 2019-06-03 NOTE — Progress Notes (Signed)
   Covid-19 Vaccination Clinic  Name:  Brenda Hickman    MRN: [id number] DOB: 12/12/1980  06/03/2019  [name] was observed post Covid-19 immunization for 15 minutes without incident. She was provided with Vaccine Information Sheet and instruction to access the V-Safe system.   [name] was instructed to call 911 with any severe reactions post vaccine: Marland Kitchen Difficulty breathing  . Swelling of face and throat  . A fast heartbeat  . A bad rash all over body  . Dizziness and weakness   Immunizations Administered    Name Date Dose VIS Date Route   Pfizer COVID-19 Vaccine 06/03/2019  8:10 AM 0.3 mL 03/08/2018 Intramuscular   Manufacturer: [employer], [employer]   Lot: [id number]   NDC: [address]-0

## 2019-11-25 ENCOUNTER — Ambulatory Visit: Payer: Self-pay

## 2019-12-16 ENCOUNTER — Other Ambulatory Visit: Payer: Self-pay

## 2019-12-16 ENCOUNTER — Ambulatory Visit: Payer: No Typology Code available for payment source | Attending: Internal Medicine

## 2019-12-16 DIAGNOSIS — Z23 Encounter for immunization: Secondary | ICD-10-CM

## 2019-12-16 NOTE — Progress Notes (Signed)
   Covid-19 Vaccination Clinic  Name:  LAMONDA NOXON    MRN: [id number] DOB: October 23, 1980  12/16/2019  Ms. Lardizabal was observed post Covid-19 immunization for 15 minutes without incident. She was provided with Vaccine Information Sheet and instruction to access the V-Safe system.   Ms. Ruble was instructed to call 911 with any severe reactions post vaccine: Marland Kitchen Difficulty breathing  . Swelling of face and throat  . A fast heartbeat  . A bad rash all over body  . Dizziness and weakness   Immunizations Administered    No immunizations on file.

## 2019-12-18 ENCOUNTER — Encounter (HOSPITAL_COMMUNITY): Payer: Self-pay | Admitting: Emergency Medicine

## 2019-12-18 ENCOUNTER — Other Ambulatory Visit: Payer: Self-pay

## 2019-12-18 ENCOUNTER — Ambulatory Visit (HOSPITAL_COMMUNITY)
Admission: EM | Admit: 2019-12-18 | Discharge: 2019-12-18 | Disposition: A | Discharge status: Home / Self Care | Payer: HRSA Program | Attending: Emergency Medicine | Admitting: Emergency Medicine

## 2019-12-18 DIAGNOSIS — Z20822 Contact with and (suspected) exposure to covid-19: Secondary | ICD-10-CM | POA: Diagnosis present

## 2019-12-18 DIAGNOSIS — R531 Weakness: Secondary | ICD-10-CM | POA: Diagnosis not present

## 2019-12-18 DIAGNOSIS — T50Z95A Adverse effect of other vaccines and biological substances, initial encounter: Secondary | ICD-10-CM

## 2019-12-18 DIAGNOSIS — R439 Unspecified disturbances of smell and taste: Secondary | ICD-10-CM | POA: Diagnosis not present

## 2019-12-18 DIAGNOSIS — R5383 Other fatigue: Secondary | ICD-10-CM

## 2019-12-18 DIAGNOSIS — R432 Parageusia: Secondary | ICD-10-CM

## 2019-12-18 LAB — SARS CORONAVIRUS 2 (TAT 6-24 HRS): SARS Coronavirus 2: NEGATIVE

## 2019-12-18 MED ORDER — ONDANSETRON 4 MG PO TBDP: 4.0000 mg | ORAL_TABLET | Freq: Three times a day (TID) | ORAL | 0 refills | Status: DC | PRN

## 2019-12-18 NOTE — Discharge Instructions (Signed)
I would expect improvement of your symptoms in the next 24 hours if they are related to your recent vaccine.  We will call you if your testing is positive, please review your results on your MyChart.

## 2019-12-18 NOTE — ED Triage Notes (Signed)
Pt c/o loss of taste and smell onset Saturday night. Pt states she got her covid booster on Saturday. Pt states she went to smoke and could not smell or taste the cigarette.

## 2019-12-18 NOTE — ED Provider Notes (Signed)
[facility]    CSN: [id number] Arrival date & time: 12/18/19  0818      History   Chief Complaint Chief Complaint  Patient presents with  . loss of taste  . loss of smell    HPI Brenda Hickman is a 39 y.o. female.   Brenda Hickman presents with complaints of loss of taste and smell, fatigue, and right arm pain. Symptoms started two days ago, following receipt of her covid-19 booster injection. No diarrhea. Some abdominal discomfort. No fevers. No cough or congestion. No sore throat. No known ill contacts.    ROS per HPI, negative if not otherwise mentioned.      Past Medical History:  Diagnosis Date  . Asthma     There are no problems to display for this patient.   History reviewed. No pertinent surgical history.  OB History    Gravida  1   Para  1   Term      Preterm      AB      Living        SAB      TAB      Ectopic      Multiple      Live Births               Home Medications    Prior to Admission medications   Medication Sig Start Date End Date Taking? Authorizing Provider  cyclobenzaprine (FLEXERIL) 10 MG tablet Take 1 tablet (10 mg total) by mouth 2 (two) times daily as needed for muscle spasms. Patient not taking: Reported on 07/18/2017 01/24/15   Barrett Henle, PA-C  ibuprofen (ADVIL,MOTRIN) 800 MG tablet Take 1 tablet (800 mg total) by mouth every 6 (six) hours as needed for moderate pain. 04/16/18   Gilda Crease, MD  methocarbamol (ROBAXIN) 500 MG tablet Take 1 tablet (500 mg total) by mouth every 8 (eight) hours as needed for muscle spasms. 04/16/18   Gilda Crease, MD  ondansetron (ZOFRAN-ODT) 4 MG disintegrating tablet Take 1 tablet (4 mg total) by mouth every 8 (eight) hours as needed for nausea or vomiting. 12/18/19   Georgetta Haber, NP  penicillin v potassium (VEETID) 500 MG tablet Take 1 tablet (500 mg total) by mouth 3 (three) times daily. Patient not taking: Reported on 12/16/2014  06/01/13   Marlon Pel, PA-C  sulfamethoxazole-trimethoprim (SEPTRA DS) 800-160 MG per tablet Take 1 tablet by mouth every 12 (twelve) hours. Patient not taking: Reported on 12/16/2014 06/01/13   Marlon Pel, PA-C    Family History Family History  Family history unknown: Yes    Social History Social History   Tobacco Use  . Smoking status: Current Every Day Smoker    Packs/day: 1.50  . Smokeless tobacco: Never Used  Substance Use Topics  . Alcohol use: Yes  . Drug use: Yes    Types: Marijuana     Allergies   Patient has no known allergies.   Review of Systems Review of Systems   Physical Exam Triage Vital Signs ED Triage Vitals  Enc Vitals Group     BP 12/18/19 0844 (!) 164/106     Pulse Rate 12/18/19 0844 96     Resp --      Temp 12/18/19 0844 98.7 F (37.1 C)     Temp Source 12/18/19 0844 Oral     SpO2 12/18/19 0844 100 %     Weight --  Height --      Head Circumference --      Peak Flow --      Pain Score 12/18/19 0839 0     Pain Loc --      Pain Edu? --      Excl. in GC? --    No data found.  Updated Vital Signs BP (!) 164/106 (BP Location: Left Arm)   Pulse 96   Temp 98.7 F (37.1 C) (Oral)   LMP 12/09/2019   SpO2 100%   Visual Acuity Right Eye Distance:   Left Eye Distance:   Bilateral Distance:    Right Eye Near:   Left Eye Near:    Bilateral Near:     Physical Exam Constitutional:      General: She is not in acute distress.    Appearance: She is well-developed.  Cardiovascular:     Rate and Rhythm: Normal rate.  Pulmonary:     Effort: Pulmonary effort is normal.  Abdominal:     Tenderness: There is no abdominal tenderness.  Skin:    General: Skin is warm and dry.  Neurological:     Mental Status: She is alert and oriented to person, place, and time.      UC Treatments / Results  Labs (all labs ordered are listed, but only abnormal results are displayed) Labs Reviewed  SARS CORONAVIRUS 2 (TAT 6-24 HRS)     EKG   Radiology No results found.  Procedures Procedures (including critical care time)  Medications Ordered in UC Medications - No data to display  Initial Impression / Assessment and Plan / UC Course  I have reviewed the triage vital signs and the nursing notes.  Pertinent labs & imaging results that were available during my care of the patient were reviewed by me and considered in my medical decision making (see chart for details).     Non toxic. Benign physical exam.  Recent covid booster, likely etiology of feeling poor. Covid testing pending and isolation instructions provided due to loss of taste and smell. Supportive cares recommended. Return precautions provided. Patient verbalized understanding and agreeable to plan.    Final Clinical Impressions(s) / UC Diagnoses   Final diagnoses:  Loss of taste  Side effects of vaccination, initial encounter  Fatigue, unspecified type     Discharge Instructions     I would expect improvement of your symptoms in the next 24 hours if they are related to your recent vaccine.  We will call you if your testing is positive, please review your results on your MyChart.    ED Prescriptions    Medication Sig Dispense Auth. Provider   ondansetron (ZOFRAN-ODT) 4 MG disintegrating tablet Take 1 tablet (4 mg total) by mouth every 8 (eight) hours as needed for nausea or vomiting. 12 tablet Georgetta Haber, NP     PDMP not reviewed this encounter.   Georgetta Haber, NP 12/18/19 [phone]

## 2020-05-21 ENCOUNTER — Ambulatory Visit (HOSPITAL_COMMUNITY)
Admission: EM | Admit: 2020-05-21 | Discharge: 2020-05-21 | Disposition: A | Discharge status: Home / Self Care | Payer: Self-pay | Attending: Internal Medicine | Admitting: Internal Medicine

## 2020-05-21 ENCOUNTER — Other Ambulatory Visit: Payer: Self-pay

## 2020-05-21 ENCOUNTER — Encounter (HOSPITAL_COMMUNITY): Payer: Self-pay | Admitting: Emergency Medicine

## 2020-05-21 DIAGNOSIS — S8011XA Contusion of right lower leg, initial encounter: Secondary | ICD-10-CM

## 2020-05-21 DIAGNOSIS — W19XXXA Unspecified fall, initial encounter: Secondary | ICD-10-CM

## 2020-05-21 MED ORDER — IBUPROFEN 800 MG PO TABS: 800.0000 mg | ORAL_TABLET | Freq: Four times a day (QID) | ORAL | 0 refills | Status: AC | PRN

## 2020-05-21 NOTE — ED Provider Notes (Signed)
[facility]    CSN: [id number] Arrival date & time: 05/21/20  1244      History   Chief Complaint Chief Complaint  Patient presents with  . Fall    HPI Brenda Hickman is a 40 y.o. female who presents due to falling off a chair at work yesterday. She was standing on a chair cleaning an ice machine and the chair tumbled and she landed on her buttocks and a chair fell onto her R knee and shin. Today she stared feeling pain on her R great toe.     Past Medical History:  Diagnosis Date  . Asthma     There are no problems to display for this patient.   History reviewed. No pertinent surgical history.  OB History    Gravida  1   Para  1   Term      Preterm      AB      Living        SAB      IAB      Ectopic      Multiple      Live Births               Home Medications    Prior to Admission medications   Medication Sig Start Date End Date Taking? Authorizing Provider  ibuprofen (ADVIL) 800 MG tablet Take 1 tablet (800 mg total) by mouth every 6 (six) hours as needed for moderate pain. 05/21/20   Rodriguez-Southworth, Nettie Elm, PA-C    Family History Family History  Problem Relation Age of Onset  . Cancer Mother     Social History Social History   Tobacco Use  . Smoking status: Current Every Day Smoker    Packs/day: 1.50  . Smokeless tobacco: Never Used  Vaping Use  . Vaping Use: Never used  Substance Use Topics  . Alcohol use: Yes  . Drug use: Yes    Types: Marijuana     Allergies   Patient has no known allergies.   Review of Systems Review of Systems  Musculoskeletal: Positive for arthralgias and gait problem. Negative for back pain, neck pain and neck stiffness.  Skin: Negative for color change, pallor, rash and wound.  Neurological: Negative for headaches.     Physical Exam Triage Vital Signs ED Triage Vitals [05/21/20 1425]  Enc Vitals Group     BP (!) 159/84     Pulse Rate 83     Resp (!) 24     Temp  98.6 F (37 C)     Temp Source Oral     SpO2 100 %     Weight      Height      Head Circumference      Peak Flow      Pain Score      Pain Loc      Pain Edu?      Excl. in GC?    No data found.  Updated Vital Signs BP (!) 159/84 (BP Location: Right Arm) Comment (BP Location): large cuff  Pulse 83   Temp 98.6 F (37 C) (Oral)   Resp (!) 24   SpO2 100%   Visual Acuity Right Eye Distance:   Left Eye Distance:   Bilateral Distance:    Right Eye Near:   Left Eye Near:    Bilateral Near:     Physical Exam Vitals (in pain when she moves) and nursing note reviewed.  Constitutional:  Appearance: She is obese.  HENT:     Head: Normocephalic.     Right Ear: External ear normal.     Left Ear: External ear normal.  Eyes:     General: No scleral icterus.    Conjunctiva/sclera: Conjunctivae normal.  Pulmonary:     Effort: Pulmonary effort is normal.  Musculoskeletal:     Cervical back: Neck supple. No tenderness.     Right lower leg: No edema.     Left lower leg: No edema.     Comments: R thigh- with soft tissue tenderness and swelling on distal quad, but ROM and strength is normal.  R KNEE- with faint bruise below patella, but no bone tenderness. R HIP- normal ROM with no pain R FOOT- no swelling or open wounds. R great toe is a little tender, but has normal ROM.   Skin:    General: Skin is warm and dry.     Findings: No rash.     Comments: BACK/ Buttocks- with no abrasions, bruising, or tederness  Neurological:     Mental Status: She is alert and oriented to person, place, and time.     Gait: Gait abnormal.     Comments: Has a limp  Psychiatric:        Mood and Affect: Mood normal.        Behavior: Behavior normal.        Thought Content: Thought content normal.    UC Treatments / Results  Labs (all labs ordered are listed, but only abnormal results are displayed) Labs Reviewed - No data to display  EKG   Radiology No results  found.  Procedures Procedures (including critical care time)  Medications Ordered in UC Medications - No data to display  Initial Impression / Assessment and Plan / UC Course  I have reviewed the triage vital signs and the nursing notes. Has contusion of R lower extremity, but since she is able to walk with a mild limp and does not have bone pain with exam, I believe she is just bruised.  I sent rx of Ibuprofen. See instructions.    Final Clinical Impressions(s) / UC Diagnoses   Final diagnoses:  Contusion of multiple sites of right lower extremity, initial encounter  Fall, initial encounter     Discharge Instructions     Ice areas of pain for 20 minutes placing a cloth over the skin for 48 hours from the onset of pain.   Follow up with your family Dr next week.      ED Prescriptions    Medication Sig Dispense Auth. Provider   ibuprofen (ADVIL) 800 MG tablet Take 1 tablet (800 mg total) by mouth every 6 (six) hours as needed for moderate pain. 30 tablet Rodriguez-Southworth, Nettie Elm, PA-C     PDMP not reviewed this encounter.   Garey Ham, PA-C 05/21/20 1454

## 2020-05-21 NOTE — Discharge Instructions (Signed)
Ice areas of pain for 20 minutes placing a cloth over the skin for 48 hours from the onset of pain.   Follow up with your family Dr next week.

## 2020-05-21 NOTE — ED Triage Notes (Signed)
Patient was standing in a chair cleaning an ice machine.  Chair moved and she fell to the floor.  Incident occurred yesterday.  Today.  Right leg is painful from hip to toes.  Right leg is swollen.

## 2020-11-13 ENCOUNTER — Encounter (HOSPITAL_COMMUNITY): Payer: Self-pay

## 2020-11-13 ENCOUNTER — Emergency Department (HOSPITAL_COMMUNITY)
Admission: EM | Admit: 2020-11-13 | Discharge: 2020-11-13 | Disposition: A | Discharge status: Home / Self Care | Payer: Self-pay | Attending: Emergency Medicine | Admitting: Emergency Medicine

## 2020-11-13 ENCOUNTER — Emergency Department (HOSPITAL_COMMUNITY): Payer: Self-pay

## 2020-11-13 ENCOUNTER — Other Ambulatory Visit: Payer: Self-pay

## 2020-11-13 DIAGNOSIS — J45909 Unspecified asthma, uncomplicated: Secondary | ICD-10-CM | POA: Insufficient documentation

## 2020-11-13 DIAGNOSIS — F17210 Nicotine dependence, cigarettes, uncomplicated: Secondary | ICD-10-CM | POA: Insufficient documentation

## 2020-11-13 DIAGNOSIS — M79662 Pain in left lower leg: Secondary | ICD-10-CM | POA: Insufficient documentation

## 2020-11-13 DIAGNOSIS — M7989 Other specified soft tissue disorders: Secondary | ICD-10-CM | POA: Insufficient documentation

## 2020-11-13 DIAGNOSIS — M25472 Effusion, left ankle: Secondary | ICD-10-CM

## 2020-11-13 MED ORDER — PREDNISONE 50 MG PO TABS: ORAL_TABLET | ORAL | 0 refills | Status: DC

## 2020-11-13 MED ORDER — HYDROCODONE-ACETAMINOPHEN 5-325 MG PO TABS: 1.0000 | ORAL_TABLET | Freq: Four times a day (QID) | ORAL | 0 refills | Status: DC | PRN

## 2020-11-13 MED ORDER — HYDROCODONE-ACETAMINOPHEN 5-325 MG PO TABS
1.0000 | ORAL_TABLET | Freq: Once | ORAL | Status: AC
  Administered 2020-11-13: 1 via ORAL
  Filled 2020-11-13: qty 1

## 2020-11-13 MED ORDER — IBUPROFEN 200 MG PO TABS
400.0000 mg | ORAL_TABLET | Freq: Once | ORAL | Status: AC
  Administered 2020-11-13: 400 mg via ORAL
  Filled 2020-11-13: qty 2

## 2020-11-13 MED ORDER — PREDNISONE 20 MG PO TABS: 60.0000 mg | ORAL_TABLET | Freq: Once | ORAL | Status: DC

## 2020-11-13 NOTE — ED Provider Notes (Signed)
[facility]-EMERGENCY DEPT Provider Note   CSN: [id number] Arrival date & time: 11/13/20  0335     History Chief complaint - left leg pain  Brenda Hickman is a 40 y.o. female.  The history is provided by the patient.  Leg Pain Location:  Ankle, foot and leg Leg location:  L lower leg Ankle location:  L ankle Foot location:  L foot Pain details:    Quality:  Aching   Severity:  Moderate   Onset quality:  Gradual   Timing:  Constant   Progression:  Worsening Chronicity:  New Relieved by:  Nothing Worsened by:  Bearing weight Associated symptoms: no fever and no muscle weakness   Patient with history of asthma and obesity presents with left leg pain.  She denies any trauma.  She reports she started the night shift at [employer] and several hours later began having pain in her left leg.  She reports it hurts to walk.  She reports the pain was so severe she called 911 She has no other acute complaints.  No previous history of surgery or injury to the leg    Past Medical History:  Diagnosis Date   Asthma        OB History     Gravida  1   Para  1   Term      Preterm      AB      Living         SAB      IAB      Ectopic      Multiple      Live Births              Family History  Problem Relation Age of Onset   Cancer Mother     Social History   Tobacco Use   Smoking status: Every Day    Packs/day: 1.50    Types: Cigarettes   Smokeless tobacco: Never  Vaping Use   Vaping Use: Never used  Substance Use Topics   Alcohol use: Yes   Drug use: Yes    Types: Marijuana    Home Medications Prior to Admission medications   Medication Sig Start Date End Date Taking? Authorizing Provider  HYDROcodone-acetaminophen (NORCO/VICODIN) 5-325 MG tablet Take 1 tablet by mouth every 6 (six) hours as needed. 11/13/20  Yes Zadie Rhine, MD  predniSONE (DELTASONE) 50 MG tablet 1 tablet PO QD X4 days 11/13/20  Yes Zadie Rhine,  MD  ibuprofen (ADVIL) 800 MG tablet Take 1 tablet (800 mg total) by mouth every 6 (six) hours as needed for moderate pain. 05/21/20   Rodriguez-Southworth, Nettie Elm, PA-C    Allergies    Patient has no known allergies.  Review of Systems   Review of Systems  Constitutional:  Negative for fever.  Musculoskeletal:  Positive for arthralgias and myalgias.  All other systems reviewed and are negative.  Physical Exam Updated Vital Signs BP (!) 188/100 (BP Location: Right Arm)   Pulse 81   Temp 98.2 F (36.8 C) (Oral)   Resp 16   Ht 1.6 m (5\' 3" )   Wt 111.1 kg   LMP 10/15/2020   SpO2 100%   BMI 43.40 kg/m   Physical Exam CONSTITUTIONAL: Well developed/well nourished, uncomfortable appearing HEAD: Normocephalic/atraumatic EYES: EOMI ENMT: Mucous membranes moist NECK: supple no meningeal signs LUNGS: no apparent distress ABDOMEN: soft, obese NEURO: Pt is awake/alert/appropriate, moves all extremitiesx4.  No facial droop.   EXTREMITIES: pulses  normal/equal, full ROM, tenderness distal tibia as well as bilateral malleoli.  Mild left foot tenderness.  Distal pulses equal intact.  No significant calf tenderness.  No significant edema.  No crepitus.  Mild swelling noted over the left ankle without any warmth SKIN: warm, color normal PSYCH: no abnormalities of mood noted, alert and oriented to situation  ED Results / Procedures / Treatments   Labs (all labs ordered are listed, but only abnormal results are displayed) Labs Reviewed - No data to display  EKG None  Radiology DG Tibia/Fibula Left  Result Date: 11/13/2020 CLINICAL DATA:  40 year old female with left lower extremity pain while at work. EXAM: LEFT TIBIA AND FIBULA - 2 VIEW COMPARISON:  Left ankle series today. FINDINGS: Bone mineralization is within normal limits. Joint spaces and alignment at the knee appear normal. No knee joint effusion is evident. There is no evidence of fracture or other focal bone lesions. Possible  subcutaneous venous varicosities but no other soft tissue abnormality. IMPRESSION: Negative. Electronically Signed   By: Odessa Fleming M.D.   On: 11/13/2020 04:45   DG Ankle 2 Views Left  Result Date: 11/13/2020 CLINICAL DATA:  40 year old female with left lower extremity pain while at work. EXAM: LEFT ANKLE - 2 VIEW COMPARISON:  None. FINDINGS: Bone mineralization is within normal limits. Mortise joint alignment preserved. There is some degenerative spurring at the tibial plafond, bilateral malleoli and calcaneus. No evidence of joint effusion. No acute osseous abnormality identified. IMPRESSION: Some degenerative spurring about the left ankle. No acute osseous abnormality identified. Electronically Signed   By: Odessa Fleming M.D.   On: 11/13/2020 04:44   DG Foot 2 Views Left  Result Date: 11/13/2020 CLINICAL DATA:  40 year old female with left lower extremity pain while at work. EXAM: LEFT FOOT - 2 VIEW COMPARISON:  Left ankle series today. FINDINGS: Bone mineralization is within normal limits. Degenerative spurring redemonstrated at the lateral malleolus, calcaneus. Preserved joint spaces and alignment in the left foot. No acute osseous abnormality identified. No discrete soft tissue abnormality. IMPRESSION: No acute osseous abnormality identified. Electronically Signed   By: Odessa Fleming M.D.   On: 11/13/2020 04:46    Procedures Procedures   Medications Ordered in ED Medications  predniSONE (DELTASONE) tablet 60 mg (60 mg Oral Not Given 11/13/20 0616)  HYDROcodone-acetaminophen (NORCO/VICODIN) 5-325 MG per tablet 1 tablet (1 tablet Oral Given 11/13/20 0411)  ibuprofen (ADVIL) tablet 400 mg (400 mg Oral Given 11/13/20 0411)    ED Course  I have reviewed the triage vital signs and the nursing notes.  Pertinent imaging results that were available during my care of the patient were reviewed by me and considered in my medical decision making (see chart for details).    MDM Rules/Calculators/A&P                            Patient presented with left ankle pain and swelling that started while working.  No trauma.  She can fully range her left ankle but it is tender to palpation.  No signs of DVT.  Distal pulses equal and intact Could be reactive arthritis.  We will start pain meds, prednisone and advised elevation.  She is low risk for septic joint.  We discussed strict  return precautions Final Clinical Impression(s) / ED Diagnoses Final diagnoses:  Left ankle swelling    Rx / DC Orders ED Discharge Orders          Ordered  HYDROcodone-acetaminophen (NORCO/VICODIN) 5-325 MG tablet  Every 6 hours PRN        11/13/20 0548    predniSONE (DELTASONE) 50 MG tablet        11/13/20 0548             Zadie Rhine, MD 11/13/20 [id number]

## 2020-11-13 NOTE — Discharge Instructions (Signed)
Keep your leg elevated.  You can place ice over the swelling Patient improved over the next 48 hours. If you have worsening pain, redness, swelling or redness up your left leg in the next 48 hours please return to the ER

## 2020-11-13 NOTE — ED Notes (Signed)
Patient transported to X-ray 

## 2020-11-13 NOTE — ED Triage Notes (Signed)
Pt complains of left ankle pain after leaving work this morning.

## 2021-08-28 ENCOUNTER — Encounter (HOSPITAL_COMMUNITY): Payer: Self-pay

## 2021-08-28 ENCOUNTER — Emergency Department (HOSPITAL_COMMUNITY)
Admission: EM | Admit: 2021-08-28 | Discharge: 2021-08-28 | Disposition: A | Discharge status: Home / Self Care | Payer: Self-pay | Attending: Emergency Medicine | Admitting: Emergency Medicine

## 2021-08-28 ENCOUNTER — Other Ambulatory Visit: Payer: Self-pay

## 2021-08-28 DIAGNOSIS — L259 Unspecified contact dermatitis, unspecified cause: Secondary | ICD-10-CM | POA: Insufficient documentation

## 2021-08-28 DIAGNOSIS — L299 Pruritus, unspecified: Secondary | ICD-10-CM | POA: Insufficient documentation

## 2021-08-28 DIAGNOSIS — L309 Dermatitis, unspecified: Secondary | ICD-10-CM

## 2021-08-28 MED ORDER — HYDROXYZINE HCL 25 MG PO TABS: 25.0000 mg | ORAL_TABLET | Freq: Four times a day (QID) | ORAL | 0 refills | Status: DC

## 2021-08-28 NOTE — ED Triage Notes (Signed)
Pt arrives via EMS from home. Pt reports she had having an itch in both hands yesterday while at work. Pt states the itching spread all over her body. She attempted a bath that had some bleach and vinegar mixed in with out any help. No obvious hives noted, patient does have reddened skin from scratching.

## 2021-08-28 NOTE — ED Provider Notes (Signed)
[facility]-EMERGENCY DEPT Provider Note   CSN: [id number] Arrival date & time: 08/28/21  1002     History  Chief Complaint  Patient presents with   Pruritis    Brenda Hickman is a 41 y.o. female.  41 year old female with prior medical history as detailed below presents for evaluation.  Patient reports that feeling itchy on her hands and her entire body.  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  Typically after coming home from work she notes that her hands are dry and itchy.  Her daughter does have a history of eczema.  She has used her to daughters skin lotions in the past for treatment of her dry skin and that has helped significantly in the past.  Patient reports that her itching was worse today.  She did not take anything for itch.  The history is provided by the patient and medical records.       Home Medications Prior to Admission medications   Medication Sig Start Date End Date Taking? Authorizing Provider  hydrOXYzine (ATARAX) 25 MG tablet Take 1 tablet (25 mg total) by mouth every 6 (six) hours. 08/28/21  Yes Wynetta Fines, MD  HYDROcodone-acetaminophen (NORCO/VICODIN) 5-325 MG tablet Take 1 tablet by mouth every 6 (six) hours as needed. 11/13/20   Zadie Rhine, MD  ibuprofen (ADVIL) 800 MG tablet Take 1 tablet (800 mg total) by mouth every 6 (six) hours as needed for moderate pain. 05/21/20   Rodriguez-Southworth, Nettie Elm, PA-C  predniSONE (DELTASONE) 50 MG tablet 1 tablet PO QD X4 days 11/13/20   Zadie Rhine, MD      Allergies    Patient has no known allergies.    Review of Systems   Review of Systems  All other systems reviewed and are negative.   Physical Exam Updated Vital Signs BP 131/74 (BP Location: Left Arm)   Pulse 87   Temp 98 F (36.7 C) (Oral)   Resp 18   Ht 5\' 3"  (1.6 m)   Wt 127 kg   SpO2 95%   BMI 49.60 kg/m  Physical Exam Vitals and nursing note reviewed.  Constitutional:       General: She is not in acute distress.    Appearance: Normal appearance. She is well-developed.  HENT:     Head: Normocephalic and atraumatic.  Eyes:     Conjunctiva/sclera: Conjunctivae normal.     Pupils: Pupils are equal, round, and reactive to light.  Cardiovascular:     Rate and Rhythm: Normal rate and regular rhythm.     Heart sounds: Normal heart sounds.  Pulmonary:     Effort: Pulmonary effort is normal. No respiratory distress.     Breath sounds: Normal breath sounds.  Abdominal:     General: There is no distension.     Palpations: Abdomen is soft.     Tenderness: There is no abdominal tenderness.  Musculoskeletal:        General: No deformity. Normal range of motion.     Cervical back: Normal range of motion and neck supple.  Skin:    General: Skin is warm and dry.     Comments: Dry scaly skin changes noted on both hands and forearms.  No urticaria.  No active evidence of systemic allergic reaction.  No other significant skin findings.  Neurological:     General: No focal deficit present.     Mental Status: She is alert and oriented to person, place, and time.  ED Results / Procedures / Treatments   Labs (all labs ordered are listed, but only abnormal results are displayed) Labs Reviewed - No data to display  EKG None  Radiology No results found.  Procedures Procedures    Medications Ordered in ED Medications - No data to display  ED Course/ Medical Decision Making/ A&P                           Medical Decision Making Risk Prescription drug management.    Medical Screen Complete  This patient presented to the ED with complaint of itching.  This complaint involves an extensive number of treatment options. The initial differential diagnosis includes, but is not limited to, eczema  This presentation is: Acute, Chronic, Self-Limited, Previously Undiagnosed, Uncertain Prognosis, and Complicated  Patient with complaint of diffuse  itching.  Patient with history suggestive of longstanding eczema.  Patient's symptoms today are not suggestive of significant allergic type reaction or other significant pathology.  Patient educated about how to care for her right likely eczema.  Importance of close follow-up stressed.  Strict return precautions given and understood.   Additional history obtained:  External records from outside sources obtained and reviewed including prior ED visits and prior Inpatient records.  Problem List / ED Course:  Itching, eczema   Reevaluation:  After the interventions noted above, I reevaluated the patient and found that they have: improved  Disposition:  After consideration of the diagnostic results and the patients response to treatment, I feel that the patent would benefit from close outpatient follow-up.          Final Clinical Impression(s) / ED Diagnoses Final diagnoses:  Itching  Eczema, unspecified type    Rx / DC Orders ED Discharge Orders          Ordered    hydrOXYzine (ATARAX) 25 MG tablet  Every 6 hours        08/28/21 1050              Wynetta Fines, MD 08/28/21 1053

## 2021-08-28 NOTE — Discharge Instructions (Addendum)
Return for any problem.  Use Atarax as prescribed for itch.  Apply cocoa butter and/or Eucerin cream as instructed for dry skin.

## 2022-04-25 IMAGING — CR DG ANKLE 2V *L*
2 series · 2 of 2 positions shown · non-contrast
Comparison: None.

CLINICAL DATA: 40-year-old female with left lower extremity pain
while at work.

EXAM:
LEFT ANKLE - 2 VIEW

[x ankle ap left]
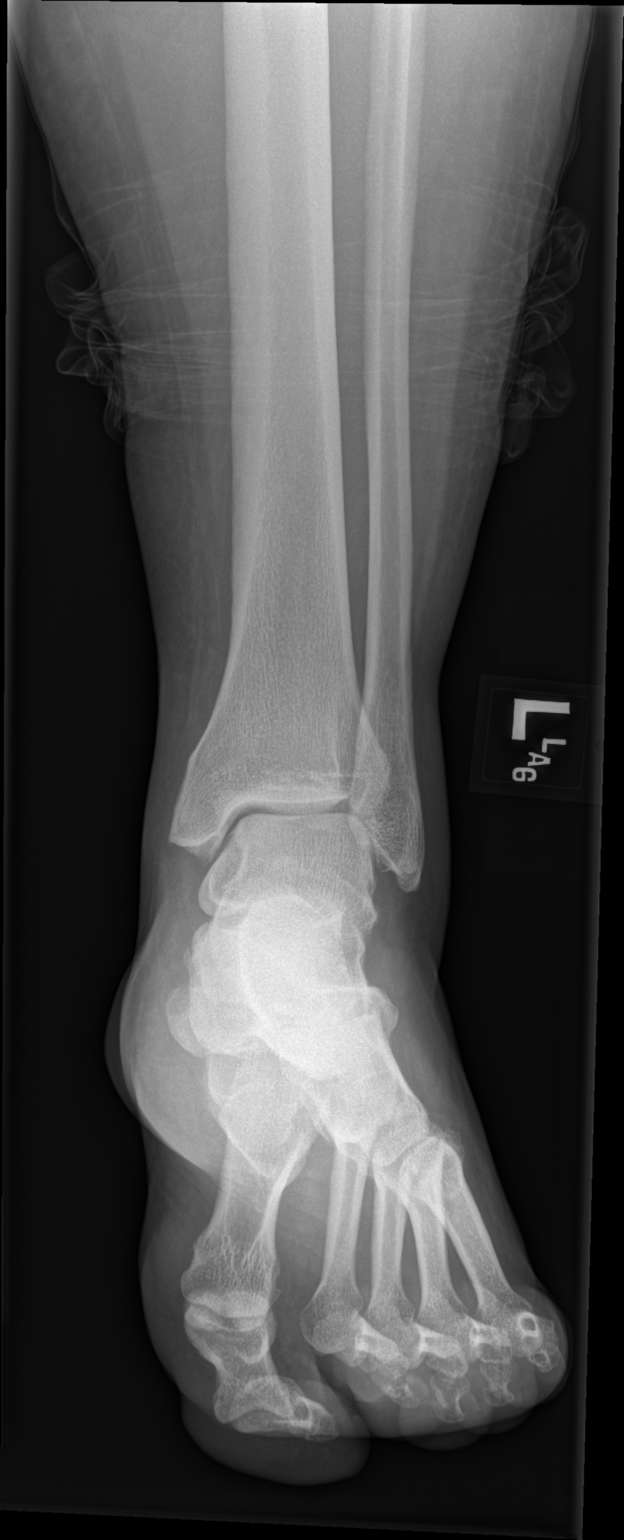

[x ankle lat left]
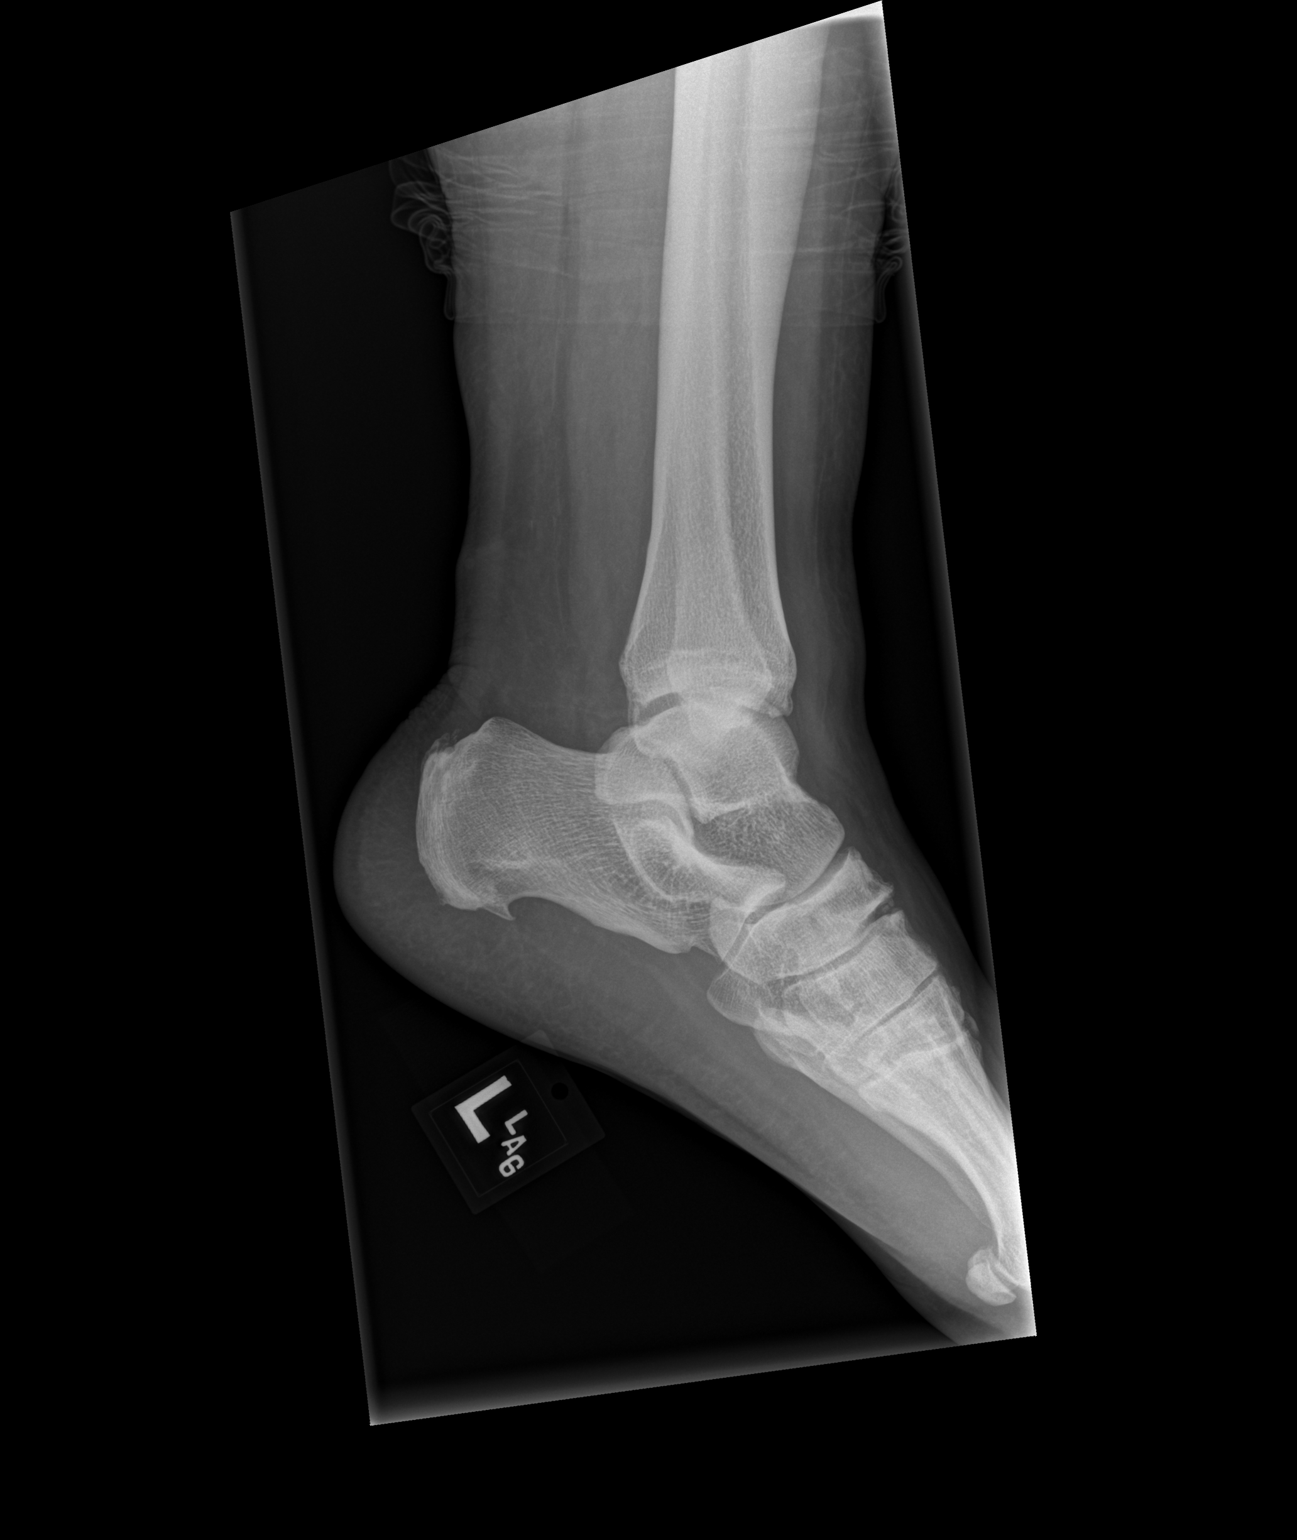

[2 of 2 positions shown; findings below may reference images not displayed]

FINDINGS: Bone mineralization is within normal limits. Mortise joint alignment
preserved. There is some degenerative spurring at the tibial
plafond, bilateral malleoli and calcaneus. No evidence of joint
effusion. No acute osseous abnormality identified.
IMPRESSION: Some degenerative spurring about the left ankle. No acute osseous
abnormality identified.

## 2022-04-25 IMAGING — CR DG TIBIA/FIBULA 2V*L*
2 series · 2 of 2 positions shown · non-contrast
Comparison: Left ankle series today.

CLINICAL DATA: 40-year-old female with left lower extremity pain
while at work.

EXAM:
LEFT TIBIA AND FIBULA - 2 VIEW

[x tib-fib ap left]
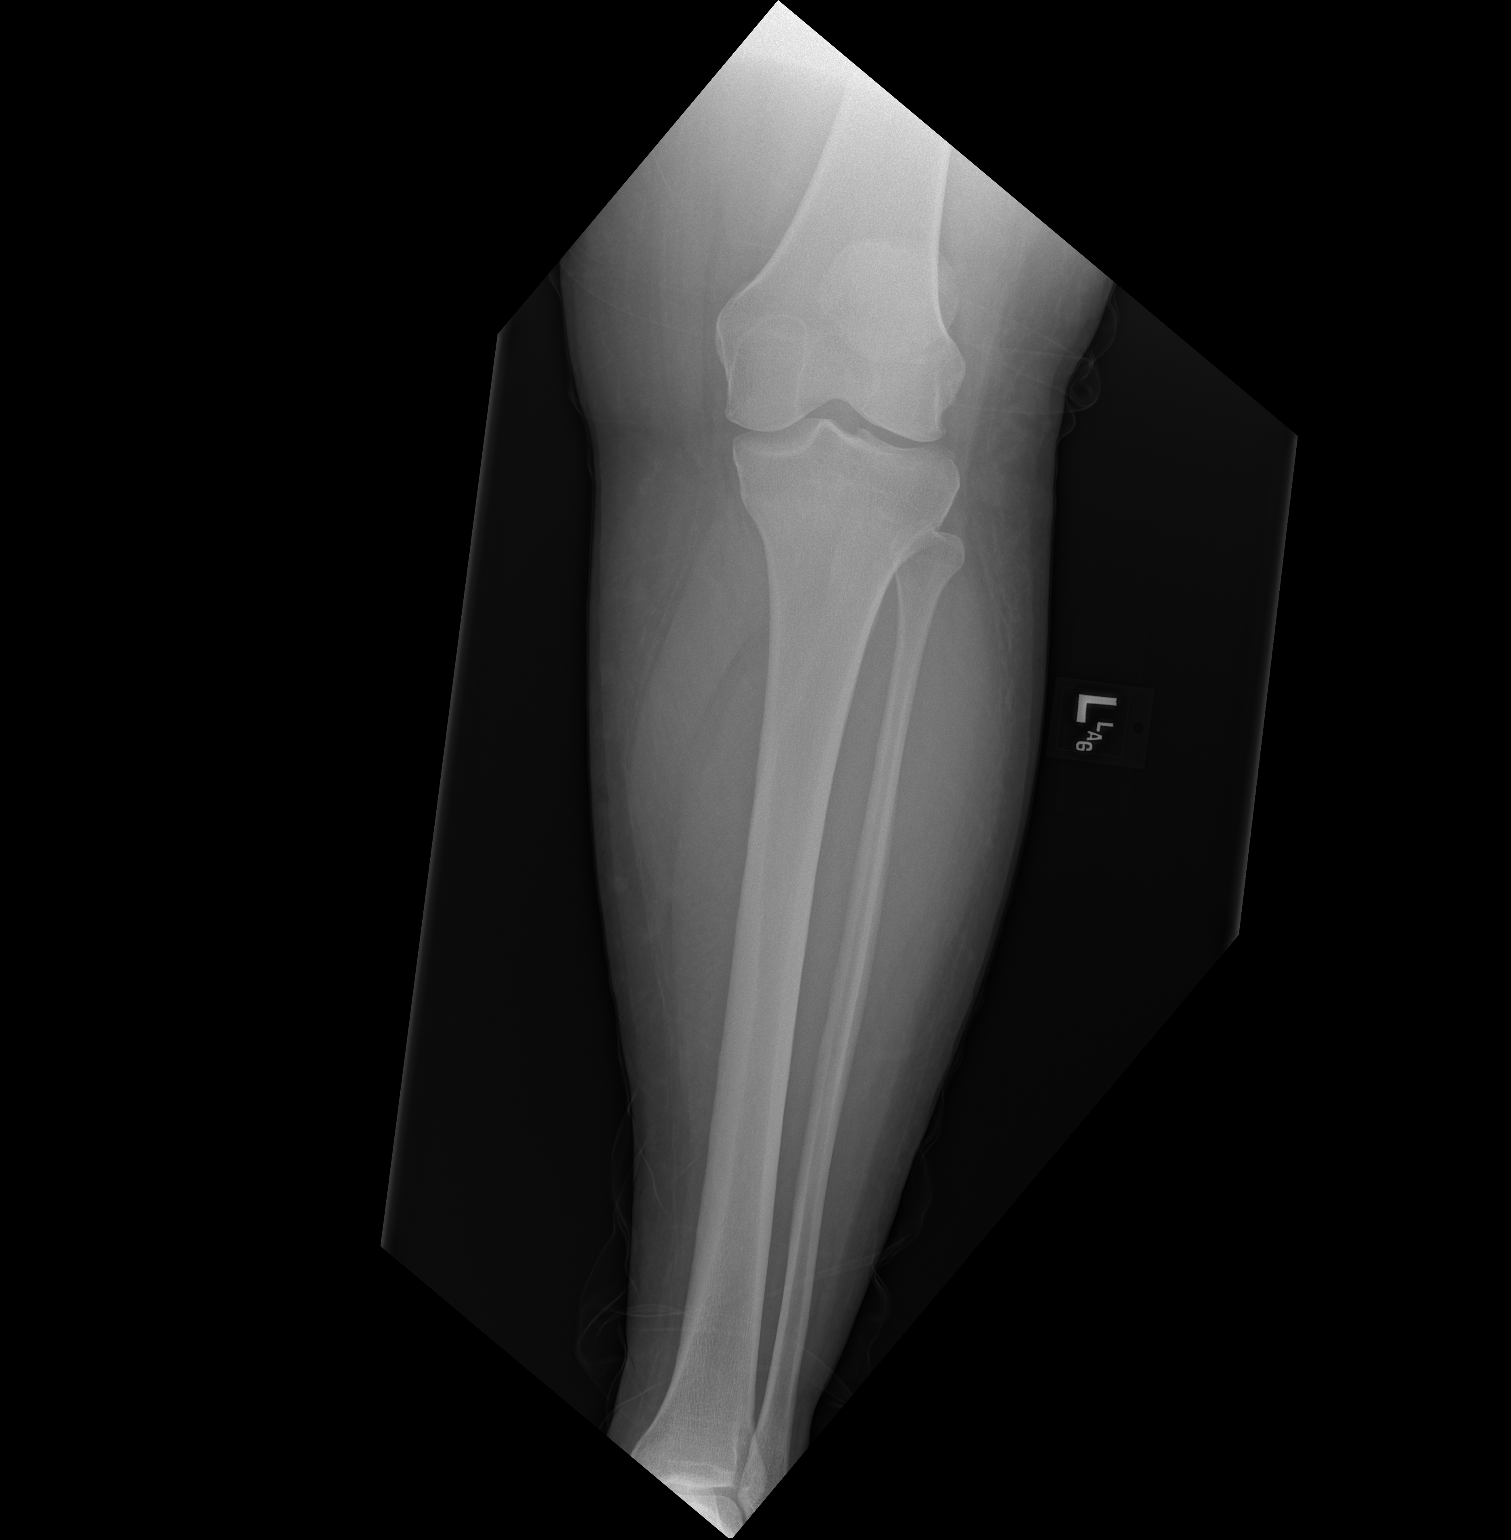

[x tib-fib lat left]
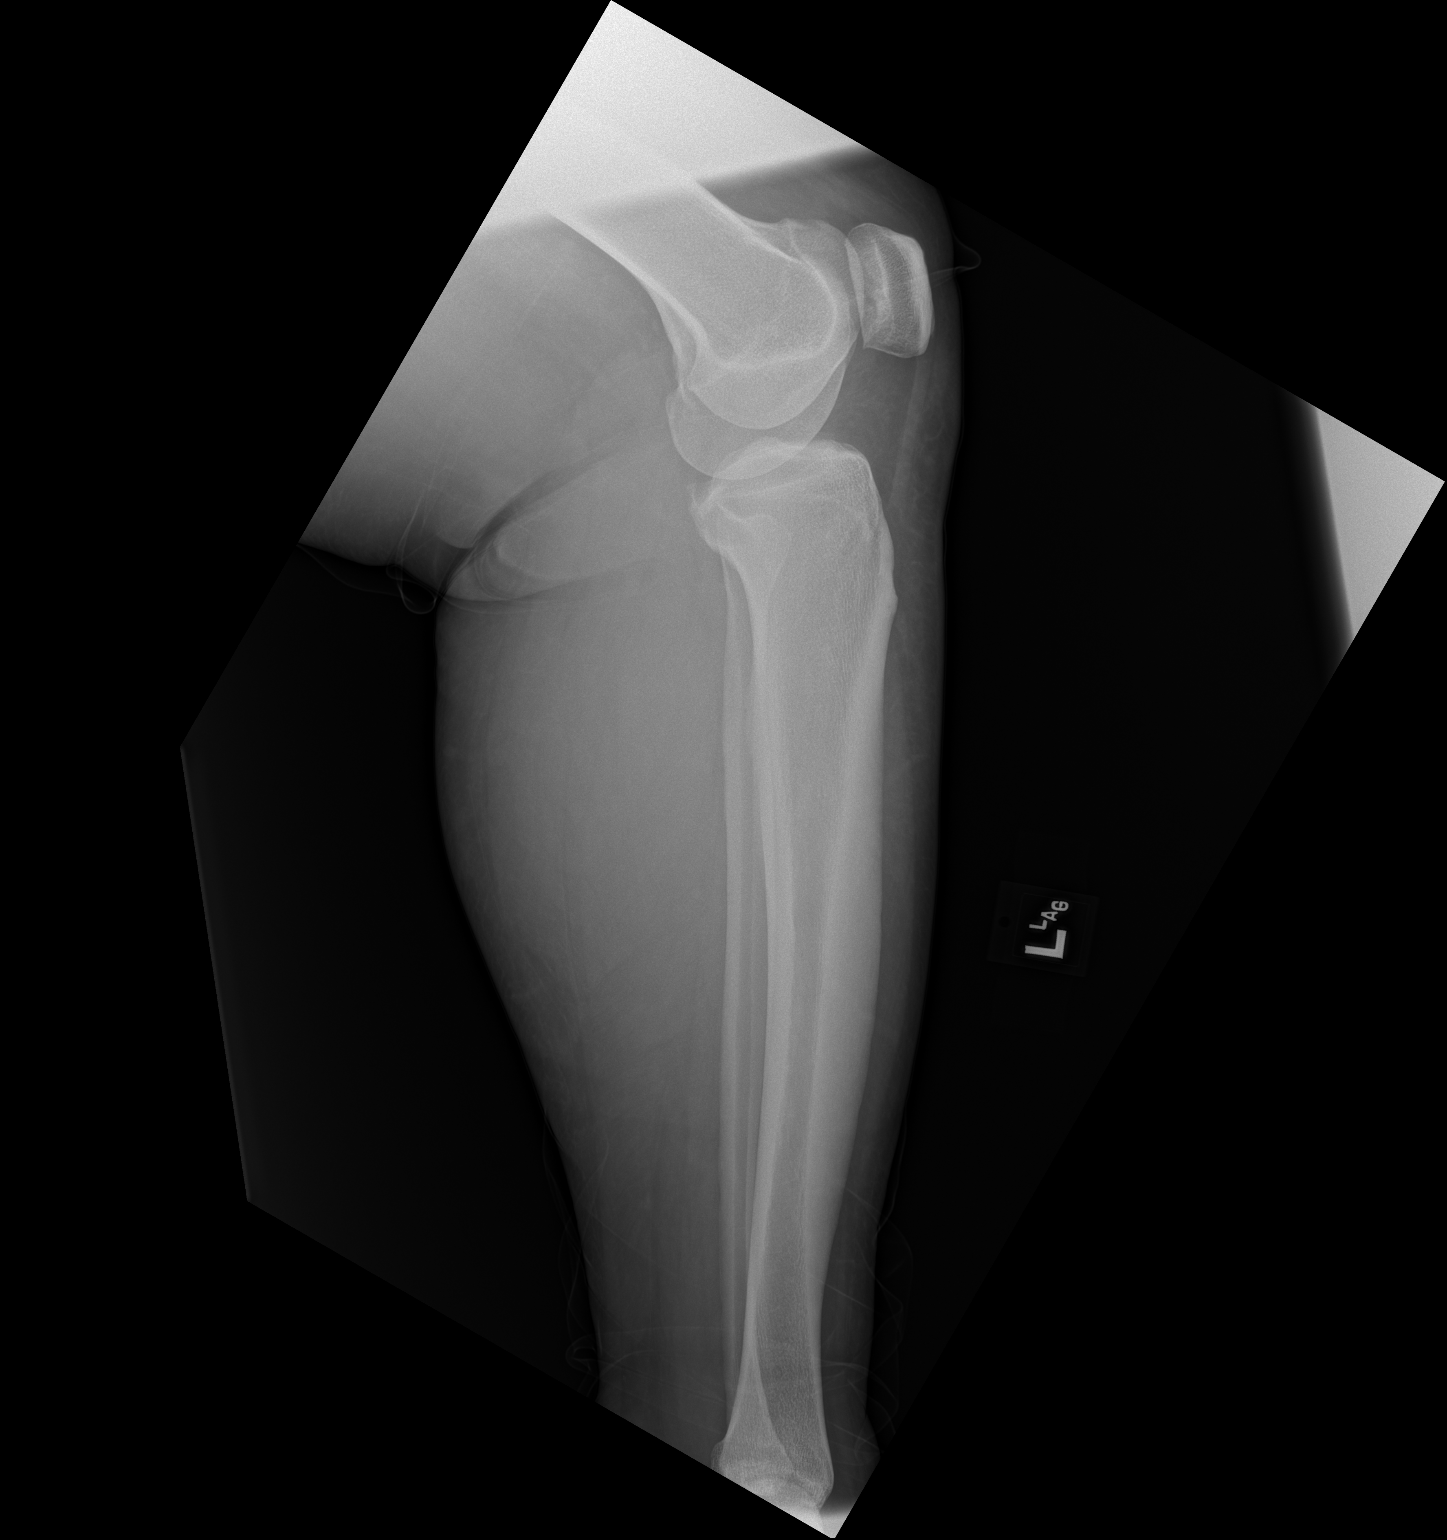

[2 of 2 positions shown; findings below may reference images not displayed]

FINDINGS: Bone mineralization is within normal limits. Joint spaces and
alignment at the knee appear normal. No knee joint effusion is
evident. There is no evidence of fracture or other focal bone
lesions. Possible subcutaneous venous varicosities but no other soft
tissue abnormality.
IMPRESSION: Negative.

## 2022-09-13 ENCOUNTER — Encounter (HOSPITAL_COMMUNITY): Payer: Self-pay | Admitting: *Deleted

## 2022-09-13 ENCOUNTER — Other Ambulatory Visit: Payer: Self-pay

## 2022-09-13 ENCOUNTER — Ambulatory Visit (HOSPITAL_COMMUNITY)
Admission: EM | Admit: 2022-09-13 | Discharge: 2022-09-13 | Disposition: A | Discharge status: Home / Self Care | Payer: Self-pay | Attending: Emergency Medicine | Admitting: Emergency Medicine

## 2022-09-13 DIAGNOSIS — R112 Nausea with vomiting, unspecified: Secondary | ICD-10-CM

## 2022-09-13 DIAGNOSIS — K2970 Gastritis, unspecified, without bleeding: Secondary | ICD-10-CM

## 2022-09-13 MED ORDER — ONDANSETRON HCL 4 MG PO TABS: 4.0000 mg | ORAL_TABLET | Freq: Four times a day (QID) | ORAL | 0 refills | Status: DC | PRN

## 2022-09-13 NOTE — ED Provider Notes (Signed)
[facility]    CSN: [id number] Arrival date & time: 09/13/22  1003      History   Chief Complaint Chief Complaint  Patient presents with   Emesis   Abdominal Pain    HPI Brenda Hickman is a 42 y.o. female.   The history is provided by the patient.  Emesis Severity:  Moderate Duration:  2 days Timing:  Intermittent Number of daily episodes:  4 Quality:  Stomach contents Able to tolerate:  Liquids How soon after eating does vomiting occur:  15 minutes Progression:  Unchanged Chronicity:  New Recent urination:  Normal Associated symptoms: abdominal pain   Abdominal Pain Pain location:  Epigastric Pain quality: aching and bloating   Pain radiates to:  Does not radiate Pain severity:  Mild Onset quality:  Gradual Progression:  Waxing and waning Chronicity:  New Associated symptoms: fatigue, nausea and vomiting     Past Medical History:  Diagnosis Date   Asthma     There are no problems to display for this patient.   History reviewed. No pertinent surgical history.  OB History     Gravida  1   Para  1   Term      Preterm      AB      Living         SAB      IAB      Ectopic      Multiple      Live Births               Home Medications    Prior to Admission medications   Medication Sig Start Date End Date Taking? Authorizing Provider  ondansetron (ZOFRAN) 4 MG tablet Take 1 tablet (4 mg total) by mouth every 6 (six) hours as needed for nausea or vomiting. 09/13/22  Yes Bria Portales, Linde Gillis, NP  HYDROcodone-acetaminophen (NORCO/VICODIN) 5-325 MG tablet Take 1 tablet by mouth every 6 (six) hours as needed. 11/13/20   Zadie Rhine, MD  hydrOXYzine (ATARAX) 25 MG tablet Take 1 tablet (25 mg total) by mouth every 6 (six) hours. 08/28/21   Wynetta Fines, MD  ibuprofen (ADVIL) 800 MG tablet Take 1 tablet (800 mg total) by mouth every 6 (six) hours as needed for moderate pain. 05/21/20   Rodriguez-Southworth, Nettie Elm, PA-C   predniSONE (DELTASONE) 50 MG tablet 1 tablet PO QD X4 days 11/13/20   Zadie Rhine, MD    Family History Family History  Problem Relation Age of Onset   Cancer Mother     Social History Social History   Tobacco Use   Smoking status: Every Day    Current packs/day: 1.50    Types: Cigarettes   Smokeless tobacco: Never  Vaping Use   Vaping status: Never Used  Substance Use Topics   Alcohol use: Yes   Drug use: Yes    Types: Marijuana     Allergies   Patient has no known allergies.   Review of Systems Review of Systems  Constitutional:  Positive for fatigue.  Gastrointestinal:  Positive for abdominal pain, nausea and vomiting.  All other systems reviewed and are negative.    Physical Exam Triage Vital Signs ED Triage Vitals  Encounter Vitals Group     BP 09/13/22 1026 (!) 150/95     Systolic BP Percentile --      Diastolic BP Percentile --      Pulse Rate 09/13/22 1026 94     Resp 09/13/22 1026  20     Temp 09/13/22 1026 98.4 F (36.9 C)     Temp src --      SpO2 09/13/22 1026 98 %     Weight --      Height --      Head Circumference --      Peak Flow --      Pain Score 09/13/22 1025 9     Pain Loc --      Pain Education --      Exclude from Growth Chart --    No data found.  Updated Vital Signs BP (!) 150/95   Pulse 94   Temp 98.4 F (36.9 C)   Resp 20   LMP 09/13/2022   SpO2 98%   Visual Acuity Right Eye Distance:   Left Eye Distance:   Bilateral Distance:    Right Eye Near:   Left Eye Near:    Bilateral Near:     Physical Exam Constitutional:      Appearance: She is well-developed. She is obese.  Cardiovascular:     Rate and Rhythm: Normal rate and regular rhythm.     Heart sounds: Normal heart sounds.  Pulmonary:     Effort: Pulmonary effort is normal.     Breath sounds: Normal breath sounds.  Abdominal:     Tenderness: There is abdominal tenderness in the epigastric area.     Comments: Soft nontender  Neurological:      Mental Status: She is alert.      UC Treatments / Results  Labs (all labs ordered are listed, but only abnormal results are displayed) Labs Reviewed - No data to display  EKG   Radiology No results found.  Procedures Procedures (including critical care time)  Medications Ordered in UC Medications - No data to display  Initial Impression / Assessment and Plan / UC Course  I have reviewed the triage vital signs and the nursing notes.  Pertinent labs & imaging results that were available during my care of the patient were reviewed by me and considered in my medical decision making (see chart for details).   Patient reports waking up Saturday morning with sudden onset of nausea and then vomiting.  She is able to hold down clear liquids.  She denies any contact with other ill persons.  She was not feeling ill Friday night or after eating Friday.  She has mild epigastric tenderness with no lower abdominal tenderness.  She denies any blood in her vomit.  Vomit is food contents. Will order Zofran for symptoms.  Continue adequate hydration.   Final Clinical Impressions(s) / UC Diagnoses   Final diagnoses:  Nausea and vomiting, unspecified vomiting type  Viral gastritis     Discharge Instructions      Clear liquid diet until vomiting has subsided.  Progress diet slowly using bananas rice applesauce and toast as tolerated.  Once these foods are tolerated, you may advance to non greasy low fiber food as tolerated.  Follow-up with PCP or in urgent care if symptoms worsen.     ED Prescriptions     Medication Sig Dispense Auth. Provider   ondansetron (ZOFRAN) 4 MG tablet Take 1 tablet (4 mg total) by mouth every 6 (six) hours as needed for nausea or vomiting. 12 tablet Modesty Rudy, Linde Gillis, NP      PDMP not reviewed this encounter.   Nelda Marseille, NP 09/13/22 [phone]

## 2022-09-13 NOTE — ED Notes (Signed)
PT also lost smell and taste

## 2022-09-13 NOTE — Discharge Instructions (Addendum)
Clear liquid diet until vomiting has subsided.  Progress diet slowly using bananas rice applesauce and toast as tolerated.  Once these foods are tolerated, you may advance to non greasy low fiber food as tolerated.  Follow-up with PCP or in urgent care if symptoms worsen.

## 2022-09-13 NOTE — ED Triage Notes (Addendum)
Pt reports ABD pain and vomiting started yesterday. Pt last vomited at 0800 today.

## 2022-10-28 ENCOUNTER — Ambulatory Visit (HOSPITAL_COMMUNITY)
Admission: EM | Admit: 2022-10-28 | Discharge: 2022-10-28 | Disposition: A | Discharge status: Home / Self Care | Payer: Self-pay | Attending: Internal Medicine | Admitting: Internal Medicine

## 2022-10-28 ENCOUNTER — Encounter (HOSPITAL_COMMUNITY): Payer: Self-pay | Admitting: Emergency Medicine

## 2022-10-28 DIAGNOSIS — Z202 Contact with and (suspected) exposure to infections with a predominantly sexual mode of transmission: Secondary | ICD-10-CM | POA: Insufficient documentation

## 2022-10-28 LAB — HIV ANTIBODY (ROUTINE TESTING W REFLEX): HIV Screen 4th Generation wRfx: NONREACTIVE

## 2022-10-28 NOTE — ED Provider Notes (Signed)
[facility]    CSN: [id number] Arrival date & time: 10/28/22  1318      History   Chief Complaint Chief Complaint  Patient presents with   Exposure to STD    HPI Brenda Hickman is a 42 y.o. female.   Patient presents to urgent care for STD testing.  She was recently sexually active with a new female partner and states that he notified her a couple of days ago that she "gave him an STD".  Patient states she received oral intercourse from this new female partner and he now has strep throat.  Patient is aware that strep throat is not an STD, however she would like to be checked for STDs.  Her sexual partner has blocked her and she is unable to clarify known exposure.  Denies abdominal pain, nausea, diarrhea, vomiting, urinary symptoms, vaginal symptoms, and fever/chills.   Exposure to STD    Past Medical History:  Diagnosis Date   Asthma     There are no problems to display for this patient.   History reviewed. No pertinent surgical history.  OB History     Gravida  1   Para  1   Term      Preterm      AB      Living         SAB      IAB      Ectopic      Multiple      Live Births               Home Medications    Prior to Admission medications   Medication Sig Start Date End Date Taking? Authorizing Provider  HYDROcodone-acetaminophen (NORCO/VICODIN) 5-325 MG tablet Take 1 tablet by mouth every 6 (Hickman) hours as needed. 11/13/20   Zadie Rhine, MD  hydrOXYzine (ATARAX) 25 MG tablet Take 1 tablet (25 mg total) by mouth every 6 (Hickman) hours. 08/28/21   Wynetta Fines, MD  ibuprofen (ADVIL) 800 MG tablet Take 1 tablet (800 mg total) by mouth every 6 (Hickman) hours as needed for moderate pain. 05/21/20   Rodriguez-Southworth, Nettie Elm, PA-C  ondansetron (ZOFRAN) 4 MG tablet Take 1 tablet (4 mg total) by mouth every 6 (Hickman) hours as needed for nausea or vomiting. 09/13/22   Blitch, Linde Gillis, NP  predniSONE (DELTASONE) 50 MG tablet 1 tablet PO  QD X4 days 11/13/20   Zadie Rhine, MD    Family History Family History  Problem Relation Age of Onset   Cancer Mother     Social History Social History   Tobacco Use   Smoking status: Every Day    Current packs/day: 1.50    Types: Cigarettes   Smokeless tobacco: Never  Vaping Use   Vaping status: Never Used  Substance Use Topics   Alcohol use: Yes   Drug use: Yes    Types: Marijuana     Allergies   Patient has no known allergies.   Review of Systems Review of Systems Per HPI  Physical Exam Triage Vital Signs ED Triage Vitals  Encounter Vitals Group     BP 10/28/22 1401 (!) 156/93     Systolic BP Percentile --      Diastolic BP Percentile --      Pulse Rate 10/28/22 1401 73     Resp 10/28/22 1401 17     Temp 10/28/22 1401 97.9 F (36.6 C)     Temp Source 10/28/22 1401 Oral  SpO2 10/28/22 1401 98 %     Weight --      Height --      Head Circumference --      Peak Flow --      Pain Score 10/28/22 1402 0     Pain Loc --      Pain Education --      Exclude from Growth Chart --    No data found.  Updated Vital Signs BP (!) 156/93 (BP Location: Left Arm)   Pulse 73   Temp 97.9 F (36.6 C) (Oral)   Resp 17   LMP 10/13/2022   SpO2 98%   Visual Acuity Right Eye Distance:   Left Eye Distance:   Bilateral Distance:    Right Eye Near:   Left Eye Near:    Bilateral Near:     Physical Exam Vitals and nursing note reviewed.  Constitutional:      Appearance: She is not ill-appearing or toxic-appearing.  HENT:     Head: Normocephalic and atraumatic.     Right Ear: Hearing and external ear normal.     Left Ear: Hearing and external ear normal.     Nose: Nose normal.     Mouth/Throat:     Lips: Pink.  Eyes:     General: Lids are normal. Vision grossly intact. Gaze aligned appropriately.     Extraocular Movements: Extraocular movements intact.     Conjunctiva/sclera: Conjunctivae normal.  Pulmonary:     Effort: Pulmonary effort is normal.   Genitourinary:    Comments: Deferred. Musculoskeletal:     Cervical back: Neck supple.  Skin:    General: Skin is warm and dry.     Capillary Refill: Capillary refill takes less than 2 seconds.     Findings: No rash.  Neurological:     General: No focal deficit present.     Mental Status: She is alert and oriented to person, place, and time. Mental status is at baseline.     Cranial Nerves: No dysarthria or facial asymmetry.  Psychiatric:        Mood and Affect: Mood normal.        Speech: Speech normal.        Behavior: Behavior normal.        Thought Content: Thought content normal.        Judgment: Judgment normal.      UC Treatments / Results  Labs (all labs ordered are listed, but only abnormal results are displayed) Labs Reviewed  HIV ANTIBODY (ROUTINE TESTING W REFLEX)  RPR  CERVICOVAGINAL ANCILLARY ONLY    EKG   Radiology No results found.  Procedures Procedures (including critical care time)  Medications Ordered in UC Medications - No data to display  Initial Impression / Assessment and Plan / UC Course  I have reviewed the triage vital signs and the nursing notes.  Pertinent labs & imaging results that were available during my care of the patient were reviewed by me and considered in my medical decision making (see chart for details).   1.  Possible exposure to STD STI labs pending, will notify patient of positive results and treat accordingly per protocol when labs result.  Patient would like HIV and syphilis testing today.   Patient to avoid sexual intercourse until screening testing comes back.   Education provided regarding safe sexual practices and patient encouraged to use protection to prevent spread of STIs.   Counseled patient on potential for adverse effects with medications  prescribed/recommended today, strict ER and return-to-clinic precautions discussed, patient verbalized understanding.    Final Clinical Impressions(s) / UC Diagnoses    Final diagnoses:  Possible exposure to STD     Discharge Instructions      STD testing pending, this will take 2-3 days to result. We will only call you if your testing is positive for any infection(s) and we will provide treatment.  Avoid sexual intercourse until your STD results come back.  If any of your STD results are positive, you will need to avoid sexual intercourse for 7 days while you are being treated to prevent spread of STD.  Condom use is the best way to prevent spread of STDs. Notify partner(s) of any positive results.  Return to urgent care as needed.     ED Prescriptions   None    PDMP not reviewed this encounter.   Carlisle Beers, [place] 10/28/22 1513

## 2022-10-28 NOTE — Discharge Instructions (Signed)

## 2022-10-28 NOTE — ED Triage Notes (Signed)
Pt reports that her ex called her stating that she gave him a STD. Pt reports that he wouldn't say what it was. Then he blocked her. Pt denies any s/s. Pt requesting testing for STD to make sure she doesn't have any infections.

## 2022-10-29 LAB — CERVICOVAGINAL ANCILLARY ONLY
Chlamydia: NEGATIVE
Comment: NEGATIVE
Comment: NEGATIVE
Comment: NORMAL
Neisseria Gonorrhea: NEGATIVE
Trichomonas: POSITIVE — AB

## 2022-10-29 LAB — RPR: RPR Ser Ql: NONREACTIVE

## 2022-11-02 ENCOUNTER — Ambulatory Visit (HOSPITAL_COMMUNITY)
Admission: EM | Admit: 2022-11-02 | Discharge: 2022-11-02 | Disposition: A | Discharge status: Home / Self Care | Payer: Self-pay | Attending: Internal Medicine | Admitting: Internal Medicine

## 2022-11-02 ENCOUNTER — Telehealth: Payer: Self-pay

## 2022-11-02 ENCOUNTER — Encounter (HOSPITAL_COMMUNITY): Payer: Self-pay

## 2022-11-02 MED ORDER — METRONIDAZOLE 500 MG PO TABS: 2000.0000 mg | ORAL_TABLET | Freq: Once | ORAL | Status: DC

## 2022-11-02 MED ORDER — METRONIDAZOLE 500 MG PO TABS
500.0000 mg | ORAL_TABLET | Freq: Two times a day (BID) | ORAL | 0 refills | Status: AC
Start: 2022-11-02 — End: 2022-11-09

## 2022-11-02 NOTE — ED Triage Notes (Addendum)
Patient here today to be treated for Trich. She does not have enough money for the prescriptions and would like to be treated here. A good RX coupon was printer for the patient and she was able to afford the prescription. Patient states that she will go get the medication.

## 2022-11-02 NOTE — Telephone Encounter (Signed)
 Per protocol, pt requires tx with metronidazole. Attempted to reach patient x1. LVM. Rx sent to pharmacy on file.

## 2022-11-02 NOTE — Telephone Encounter (Signed)
Pt to return to UC for single dose of Metronidazole for treatment of Trichomonas, as pt states she cannot afford to pick up prescription until next month.  Pt verbalized understanding of instructions.   Contacted patient by phone.  Verified identity using two identifiers.  Provided positive result.  Reviewed safe sex practices, notifying partners, and refraining from sexual activities for 7 days from time of treatment.  Patient verified understanding, all questions answered.
